# Patient Record
Sex: Female | Born: 1950 | Race: White | Hispanic: No | Marital: Single | Smoking: Never smoker
Health system: Southern US, Community
[De-identification: ages and names within clinical notes are randomized; demographics above are authoritative.]

## PROBLEM LIST (undated history)

## (undated) DIAGNOSIS — E119 Type 2 diabetes mellitus without complications: Secondary | ICD-10-CM

## (undated) DIAGNOSIS — D649 Anemia, unspecified: Secondary | ICD-10-CM

## (undated) DIAGNOSIS — M1990 Unspecified osteoarthritis, unspecified site: Secondary | ICD-10-CM

## (undated) DIAGNOSIS — I739 Peripheral vascular disease, unspecified: Secondary | ICD-10-CM

## (undated) DIAGNOSIS — I219 Acute myocardial infarction, unspecified: Secondary | ICD-10-CM

## (undated) DIAGNOSIS — G629 Polyneuropathy, unspecified: Secondary | ICD-10-CM

## (undated) DIAGNOSIS — M109 Gout, unspecified: Secondary | ICD-10-CM

## (undated) DIAGNOSIS — I10 Essential (primary) hypertension: Secondary | ICD-10-CM

## (undated) DIAGNOSIS — F419 Anxiety disorder, unspecified: Secondary | ICD-10-CM

## (undated) DIAGNOSIS — I2510 Atherosclerotic heart disease of native coronary artery without angina pectoris: Secondary | ICD-10-CM

## (undated) DIAGNOSIS — K219 Gastro-esophageal reflux disease without esophagitis: Secondary | ICD-10-CM

## (undated) DIAGNOSIS — F329 Major depressive disorder, single episode, unspecified: Secondary | ICD-10-CM

## (undated) DIAGNOSIS — G4730 Sleep apnea, unspecified: Secondary | ICD-10-CM

## (undated) DIAGNOSIS — E785 Hyperlipidemia, unspecified: Secondary | ICD-10-CM

## (undated) DIAGNOSIS — F32A Depression, unspecified: Secondary | ICD-10-CM

## (undated) DIAGNOSIS — N189 Chronic kidney disease, unspecified: Secondary | ICD-10-CM

## (undated) HISTORY — PX: CARDIAC CATHETERIZATION: SHX172

## (undated) HISTORY — PX: CORONARY ARTERY BYPASS GRAFT: SHX141

## (undated) HISTORY — PX: EYE SURGERY: SHX253

## (undated) HISTORY — PX: CORONARY ANGIOPLASTY: SHX604

## (undated) HISTORY — PX: BACK SURGERY: SHX140

## (undated) HISTORY — PX: LEG SURGERY: SHX1003

## (undated) HISTORY — PX: CHOLECYSTECTOMY: SHX55

---

## 1991-10-27 DIAGNOSIS — I219 Acute myocardial infarction, unspecified: Secondary | ICD-10-CM

## 1991-10-27 HISTORY — DX: Acute myocardial infarction, unspecified: I21.9

## 2007-10-27 DIAGNOSIS — D649 Anemia, unspecified: Secondary | ICD-10-CM

## 2007-10-27 HISTORY — DX: Anemia, unspecified: D64.9

## 2016-08-18 ENCOUNTER — Encounter: Payer: Self-pay | Admitting: Emergency Medicine

## 2016-08-18 ENCOUNTER — Emergency Department: Payer: Medicare Other

## 2016-08-18 ENCOUNTER — Inpatient Hospital Stay
Admission: EM | Admit: 2016-08-18 | Discharge: 2016-08-25 | DRG: 853 | Disposition: A | Discharge status: Skilled Nursing Facility | Payer: Medicare Other | Attending: Internal Medicine | Admitting: Internal Medicine

## 2016-08-18 DIAGNOSIS — A419 Sepsis, unspecified organism: Secondary | ICD-10-CM | POA: Diagnosis present

## 2016-08-18 DIAGNOSIS — N2581 Secondary hyperparathyroidism of renal origin: Secondary | ICD-10-CM | POA: Diagnosis present

## 2016-08-18 DIAGNOSIS — E1169 Type 2 diabetes mellitus with other specified complication: Secondary | ICD-10-CM | POA: Diagnosis present

## 2016-08-18 DIAGNOSIS — I129 Hypertensive chronic kidney disease with stage 1 through stage 4 chronic kidney disease, or unspecified chronic kidney disease: Secondary | ICD-10-CM | POA: Diagnosis present

## 2016-08-18 DIAGNOSIS — L97519 Non-pressure chronic ulcer of other part of right foot with unspecified severity: Secondary | ICD-10-CM | POA: Diagnosis present

## 2016-08-18 DIAGNOSIS — Z794 Long term (current) use of insulin: Secondary | ICD-10-CM

## 2016-08-18 DIAGNOSIS — E119 Type 2 diabetes mellitus without complications: Secondary | ICD-10-CM | POA: Diagnosis not present

## 2016-08-18 DIAGNOSIS — F329 Major depressive disorder, single episode, unspecified: Secondary | ICD-10-CM | POA: Diagnosis present

## 2016-08-18 DIAGNOSIS — E785 Hyperlipidemia, unspecified: Secondary | ICD-10-CM | POA: Diagnosis present

## 2016-08-18 DIAGNOSIS — L03115 Cellulitis of right lower limb: Secondary | ICD-10-CM | POA: Diagnosis present

## 2016-08-18 DIAGNOSIS — I70248 Atherosclerosis of native arteries of left leg with ulceration of other part of lower left leg: Secondary | ICD-10-CM | POA: Diagnosis not present

## 2016-08-18 DIAGNOSIS — Z79899 Other long term (current) drug therapy: Secondary | ICD-10-CM

## 2016-08-18 DIAGNOSIS — R000 Tachycardia, unspecified: Secondary | ICD-10-CM | POA: Diagnosis present

## 2016-08-18 DIAGNOSIS — I130 Hypertensive heart and chronic kidney disease with heart failure and stage 1 through stage 4 chronic kidney disease, or unspecified chronic kidney disease: Secondary | ICD-10-CM | POA: Diagnosis present

## 2016-08-18 DIAGNOSIS — I10 Essential (primary) hypertension: Secondary | ICD-10-CM | POA: Diagnosis present

## 2016-08-18 DIAGNOSIS — N184 Chronic kidney disease, stage 4 (severe): Secondary | ICD-10-CM | POA: Diagnosis present

## 2016-08-18 DIAGNOSIS — R0600 Dyspnea, unspecified: Secondary | ICD-10-CM

## 2016-08-18 DIAGNOSIS — E1121 Type 2 diabetes mellitus with diabetic nephropathy: Secondary | ICD-10-CM | POA: Diagnosis present

## 2016-08-18 DIAGNOSIS — M1990 Unspecified osteoarthritis, unspecified site: Secondary | ICD-10-CM | POA: Diagnosis present

## 2016-08-18 DIAGNOSIS — Z951 Presence of aortocoronary bypass graft: Secondary | ICD-10-CM

## 2016-08-18 DIAGNOSIS — F419 Anxiety disorder, unspecified: Secondary | ICD-10-CM | POA: Diagnosis present

## 2016-08-18 DIAGNOSIS — N179 Acute kidney failure, unspecified: Secondary | ICD-10-CM | POA: Diagnosis present

## 2016-08-18 DIAGNOSIS — I2510 Atherosclerotic heart disease of native coronary artery without angina pectoris: Secondary | ICD-10-CM | POA: Diagnosis present

## 2016-08-18 DIAGNOSIS — T148XXA Other injury of unspecified body region, initial encounter: Secondary | ICD-10-CM

## 2016-08-18 DIAGNOSIS — E1152 Type 2 diabetes mellitus with diabetic peripheral angiopathy with gangrene: Secondary | ICD-10-CM | POA: Diagnosis present

## 2016-08-18 DIAGNOSIS — K219 Gastro-esophageal reflux disease without esophagitis: Secondary | ICD-10-CM | POA: Diagnosis present

## 2016-08-18 DIAGNOSIS — Z6837 Body mass index (BMI) 37.0-37.9, adult: Secondary | ICD-10-CM

## 2016-08-18 DIAGNOSIS — L97509 Non-pressure chronic ulcer of other part of unspecified foot with unspecified severity: Secondary | ICD-10-CM

## 2016-08-18 DIAGNOSIS — E11621 Type 2 diabetes mellitus with foot ulcer: Secondary | ICD-10-CM | POA: Diagnosis present

## 2016-08-18 DIAGNOSIS — E669 Obesity, unspecified: Secondary | ICD-10-CM | POA: Diagnosis present

## 2016-08-18 DIAGNOSIS — D631 Anemia in chronic kidney disease: Secondary | ICD-10-CM | POA: Diagnosis present

## 2016-08-18 DIAGNOSIS — M869 Osteomyelitis, unspecified: Secondary | ICD-10-CM | POA: Diagnosis present

## 2016-08-18 DIAGNOSIS — I5023 Acute on chronic systolic (congestive) heart failure: Secondary | ICD-10-CM | POA: Diagnosis present

## 2016-08-18 DIAGNOSIS — E1142 Type 2 diabetes mellitus with diabetic polyneuropathy: Secondary | ICD-10-CM | POA: Diagnosis present

## 2016-08-18 DIAGNOSIS — E1140 Type 2 diabetes mellitus with diabetic neuropathy, unspecified: Secondary | ICD-10-CM | POA: Diagnosis present

## 2016-08-18 DIAGNOSIS — E11628 Type 2 diabetes mellitus with other skin complications: Secondary | ICD-10-CM | POA: Diagnosis present

## 2016-08-18 DIAGNOSIS — L98499 Non-pressure chronic ulcer of skin of other sites with unspecified severity: Secondary | ICD-10-CM | POA: Diagnosis present

## 2016-08-18 DIAGNOSIS — L89899 Pressure ulcer of other site, unspecified stage: Secondary | ICD-10-CM | POA: Diagnosis not present

## 2016-08-18 DIAGNOSIS — E1122 Type 2 diabetes mellitus with diabetic chronic kidney disease: Secondary | ICD-10-CM | POA: Diagnosis present

## 2016-08-18 DIAGNOSIS — M79604 Pain in right leg: Secondary | ICD-10-CM | POA: Diagnosis not present

## 2016-08-18 DIAGNOSIS — L089 Local infection of the skin and subcutaneous tissue, unspecified: Secondary | ICD-10-CM

## 2016-08-18 HISTORY — DX: Gastro-esophageal reflux disease without esophagitis: K21.9

## 2016-08-18 HISTORY — DX: Atherosclerotic heart disease of native coronary artery without angina pectoris: I25.10

## 2016-08-18 HISTORY — DX: Anxiety disorder, unspecified: F41.9

## 2016-08-18 HISTORY — DX: Hyperlipidemia, unspecified: E78.5

## 2016-08-18 HISTORY — DX: Type 2 diabetes mellitus without complications: E11.9

## 2016-08-18 HISTORY — DX: Essential (primary) hypertension: I10

## 2016-08-18 LAB — COMPREHENSIVE METABOLIC PANEL
ALBUMIN: 2.6 g/dL — AB (ref 3.5–5.0)
ALT: 11 U/L — ABNORMAL LOW (ref 14–54)
ANION GAP: 13 (ref 5–15)
AST: 21 U/L (ref 15–41)
Alkaline Phosphatase: 66 U/L (ref 38–126)
BUN: 46 mg/dL — AB (ref 6–20)
CHLORIDE: 100 mmol/L — AB (ref 101–111)
CO2: 20 mmol/L — ABNORMAL LOW (ref 22–32)
Calcium: 8.3 mg/dL — ABNORMAL LOW (ref 8.9–10.3)
Creatinine, Ser: 3.53 mg/dL — ABNORMAL HIGH (ref 0.44–1.00)
GFR calc Af Amer: 15 mL/min — ABNORMAL LOW (ref >60)
GFR, EST NON AFRICAN AMERICAN: 13 mL/min — AB (ref >60)
Glucose, Bld: 357 mg/dL — ABNORMAL HIGH (ref 65–99)
POTASSIUM: 3.8 mmol/L (ref 3.5–5.1)
Sodium: 133 mmol/L — ABNORMAL LOW (ref 135–145)
Total Bilirubin: 0.3 mg/dL (ref 0.3–1.2)
Total Protein: 7.1 g/dL (ref 6.5–8.1)

## 2016-08-18 LAB — CBC WITH DIFFERENTIAL/PLATELET
Basophils Absolute: 0 10*3/uL (ref 0–0.1)
Basophils Relative: 0 %
EOS PCT: 0 %
Eosinophils Absolute: 0 10*3/uL (ref 0–0.7)
HEMATOCRIT: 28.1 % — AB (ref 35.0–47.0)
Hemoglobin: 9.1 g/dL — ABNORMAL LOW (ref 12.0–16.0)
LYMPHS PCT: 4 %
Lymphs Abs: 0.7 10*3/uL — ABNORMAL LOW (ref 1.0–3.6)
MCH: 27.9 pg (ref 26.0–34.0)
MCHC: 32.5 g/dL (ref 32.0–36.0)
MCV: 85.8 fL (ref 80.0–100.0)
Monocytes Absolute: 1.2 10*3/uL — ABNORMAL HIGH (ref 0.2–0.9)
Monocytes Relative: 7 %
NEUTROS ABS: 15.1 10*3/uL — AB (ref 1.4–6.5)
Neutrophils Relative %: 89 %
PLATELETS: 285 10*3/uL (ref 150–440)
RBC: 3.27 MIL/uL — AB (ref 3.80–5.20)
RDW: 14.6 % — ABNORMAL HIGH (ref 11.5–14.5)
WBC: 17 10*3/uL — AB (ref 3.6–11.0)

## 2016-08-18 LAB — LACTIC ACID, PLASMA: Lactic Acid, Venous: 1.5 mmol/L (ref 0.5–1.9)

## 2016-08-18 LAB — GLUCOSE, CAPILLARY: GLUCOSE-CAPILLARY: 245 mg/dL — AB (ref 65–99)

## 2016-08-18 MED ORDER — SODIUM CHLORIDE 0.9 % IV BOLUS (SEPSIS)
1000.0000 mL | Freq: Once | INTRAVENOUS | Status: AC
  Administered 2016-08-18: 1000 mL via INTRAVENOUS

## 2016-08-18 MED ORDER — OXYCODONE HCL 5 MG PO TABS
5.0000 mg | ORAL_TABLET | ORAL | Status: DC | PRN
  Administered 2016-08-18 – 2016-08-19 (×2): 5 mg via ORAL
  Filled 2016-08-18 (×2): qty 1

## 2016-08-18 MED ORDER — SODIUM CHLORIDE 0.9 % IV SOLN
INTRAVENOUS | Status: AC
  Administered 2016-08-18: via INTRAVENOUS

## 2016-08-18 MED ORDER — ACETAMINOPHEN 500 MG PO TABS
1000.0000 mg | ORAL_TABLET | Freq: Once | ORAL | Status: AC
  Administered 2016-08-18: 1000 mg via ORAL
  Filled 2016-08-18: qty 2

## 2016-08-18 MED ORDER — ATORVASTATIN CALCIUM 10 MG PO TABS
10.0000 mg | ORAL_TABLET | Freq: Every day | ORAL | Status: DC
  Administered 2016-08-19 – 2016-08-25 (×7): 10 mg via ORAL
  Filled 2016-08-18 (×7): qty 1

## 2016-08-18 MED ORDER — FLUOXETINE HCL 20 MG PO CAPS
40.0000 mg | ORAL_CAPSULE | Freq: Every day | ORAL | Status: DC
  Administered 2016-08-19 – 2016-08-25 (×7): 40 mg via ORAL
  Filled 2016-08-18 (×7): qty 2

## 2016-08-18 MED ORDER — ONDANSETRON HCL 4 MG PO TABS: 4.0000 mg | ORAL_TABLET | Freq: Four times a day (QID) | ORAL | Status: DC | PRN

## 2016-08-18 MED ORDER — ONDANSETRON HCL 4 MG/2ML IJ SOLN: 4.0000 mg | Freq: Four times a day (QID) | INTRAMUSCULAR | Status: DC | PRN

## 2016-08-18 MED ORDER — ISOSORBIDE MONONITRATE ER 30 MG PO TB24
120.0000 mg | ORAL_TABLET | Freq: Every day | ORAL | Status: DC
Start: 2016-08-19 — End: 2016-08-25
  Administered 2016-08-19 – 2016-08-25 (×7): 120 mg via ORAL
  Filled 2016-08-18 (×7): qty 4

## 2016-08-18 MED ORDER — PANTOPRAZOLE SODIUM 40 MG PO TBEC
40.0000 mg | DELAYED_RELEASE_TABLET | Freq: Every day | ORAL | Status: DC
  Administered 2016-08-19 – 2016-08-25 (×7): 40 mg via ORAL
  Filled 2016-08-18 (×7): qty 1

## 2016-08-18 MED ORDER — VANCOMYCIN HCL 10 G IV SOLR
1250.0000 mg | INTRAVENOUS | Status: DC
  Administered 2016-08-19 – 2016-08-21 (×2): 1250 mg via INTRAVENOUS
  Filled 2016-08-18 (×3): qty 1250

## 2016-08-18 MED ORDER — SODIUM CHLORIDE 0.9 % IV BOLUS (SEPSIS)
500.0000 mL | Freq: Once | INTRAVENOUS | Status: AC
  Administered 2016-08-18: 500 mL via INTRAVENOUS

## 2016-08-18 MED ORDER — VANCOMYCIN HCL 10 G IV SOLR
1500.0000 mg | Freq: Once | INTRAVENOUS | Status: AC
  Administered 2016-08-18: 1500 mg via INTRAVENOUS
  Filled 2016-08-18: qty 1500

## 2016-08-18 MED ORDER — GABAPENTIN 400 MG PO CAPS
800.0000 mg | ORAL_CAPSULE | Freq: Two times a day (BID) | ORAL | Status: DC
  Administered 2016-08-18 – 2016-08-25 (×14): 800 mg via ORAL
  Filled 2016-08-18 (×14): qty 2

## 2016-08-18 MED ORDER — PIPERACILLIN-TAZOBACTAM 3.375 G IVPB
3.3750 g | Freq: Three times a day (TID) | INTRAVENOUS | Status: DC
  Administered 2016-08-18 – 2016-08-23 (×13): 3.375 g via INTRAVENOUS
  Filled 2016-08-18 (×13): qty 50

## 2016-08-18 MED ORDER — ACETAMINOPHEN 650 MG RE SUPP: 650.0000 mg | Freq: Four times a day (QID) | RECTAL | Status: DC | PRN

## 2016-08-18 MED ORDER — ACETAMINOPHEN 325 MG PO TABS: 650.0000 mg | ORAL_TABLET | Freq: Four times a day (QID) | ORAL | Status: DC | PRN

## 2016-08-18 MED ORDER — LORAZEPAM 1 MG PO TABS
1.0000 mg | ORAL_TABLET | Freq: Two times a day (BID) | ORAL | Status: DC
  Administered 2016-08-18 – 2016-08-25 (×14): 1 mg via ORAL
  Filled 2016-08-18 (×15): qty 1

## 2016-08-18 MED ORDER — PIPERACILLIN-TAZOBACTAM 3.375 G IVPB 30 MIN
3.3750 g | Freq: Once | INTRAVENOUS | Status: AC
  Administered 2016-08-18: 3.375 g via INTRAVENOUS
  Filled 2016-08-18: qty 50

## 2016-08-18 MED ORDER — HEPARIN SODIUM (PORCINE) 5000 UNIT/ML IJ SOLN
5000.0000 [IU] | Freq: Three times a day (TID) | INTRAMUSCULAR | Status: DC
  Administered 2016-08-18 – 2016-08-25 (×19): 5000 [IU] via SUBCUTANEOUS
  Filled 2016-08-18 (×19): qty 1

## 2016-08-18 MED ORDER — INSULIN ASPART 100 UNIT/ML ~~LOC~~ SOLN
0.0000 [IU] | Freq: Four times a day (QID) | SUBCUTANEOUS | Status: DC
  Administered 2016-08-18: 3 [IU] via SUBCUTANEOUS
  Administered 2016-08-19: 5 [IU] via SUBCUTANEOUS
  Administered 2016-08-19 – 2016-08-20 (×5): 2 [IU] via SUBCUTANEOUS
  Filled 2016-08-18 (×2): qty 2
  Filled 2016-08-18: qty 3
  Filled 2016-08-18: qty 2
  Filled 2016-08-18: qty 3
  Filled 2016-08-18: qty 5
  Filled 2016-08-18: qty 2

## 2016-08-18 NOTE — ED Notes (Signed)
Pt provided turkey sandwich tray. 

## 2016-08-18 NOTE — ED Notes (Addendum)
Pt uprite on stretcher in exam room watching TV with no c/o voiced at present; pt eating ice chips; instructed to avoid further PO intake until temperature rechecked; pt orders reviewed and verified with lab all blood samples received as ordered; pt instructed on need to obtain urine specimen and will notify nurse when ready; call bell in reach and pt voices good understanding of plan of care

## 2016-08-18 NOTE — Progress Notes (Signed)
Pt states she sleeps with CPAP and 02 @ 2 liters. Rder received

## 2016-08-18 NOTE — ED Notes (Signed)
CODE  SEPSIS  CALLED  TO  CARELINK 

## 2016-08-18 NOTE — ED Provider Notes (Signed)
[facility] Emergency Department Provider Note  ____________________________________________  Time seen: Approximately 6:45 PM  I have reviewed the triage vital signs and the nursing notes.   HISTORY  Chief Complaint Wound Infection   HPI Brooke [name] is a 65 y.o. female a history of CAD, type 2 diabetes, hypertension or presents for evaluation of ulcer on her right foot. Patient reports that she developed this ulcer 3 days ago and today noticed a black scar over it. She started to have chills and body aches today. No fever, no N/V, no cough, no shortness of breath, no chest pain, no abdominal pain, no diarrhea, no dysuria. Patient reports that she has not been checking her sugars recently so she does not know if they're been running high. She denies pain in her foot and reports that she does not have sensation on the foot.   Past Medical History:  Diagnosis Date  . Anxiety   . Coronary artery disease   . Diabetes mellitus without complication (HCC)   . GERD (gastroesophageal reflux disease)   . HLD (hyperlipidemia)   . Hypertension     Patient Active Problem List   Diagnosis Date Noted  . Sepsis (HCC) 08/18/2016  . Diabetes mellitus with foot ulcer (HCC) 08/18/2016  . HTN (hypertension) 08/18/2016  . CAD (coronary artery disease) 08/18/2016  . AKI (acute kidney injury) (HCC) 08/18/2016  . Anxiety 08/18/2016  . GERD (gastroesophageal reflux disease) 08/18/2016  . HLD (hyperlipidemia) 08/18/2016    Past Surgical History:  Procedure Laterality Date  . CHOLECYSTECTOMY    . CORONARY ARTERY BYPASS GRAFT    . LEG SURGERY Left     Prior to Admission medications   Medication Sig Start Date End Date Taking? Authorizing Provider  amLODipine (NORVASC) 5 MG tablet Take 5 mg by mouth every evening. 06/08/16  Yes Historical Provider, MD  atorvastatin (LIPITOR) 10 MG tablet Take 10 mg by mouth daily. 05/18/16  Yes Historical Provider, MD  famotidine  (PEPCID) 20 MG tablet Take 1 tablet by mouth daily. 06/24/16  Yes Historical Provider, MD  FLUoxetine (PROZAC) 40 MG capsule Take 1 capsule by mouth daily. 07/30/16  Yes Historical Provider, MD  furosemide (LASIX) 80 MG tablet Take 80 mg by mouth 2 (two) times daily.  06/08/16  Yes Historical Provider, MD  gabapentin (NEURONTIN) 800 MG tablet Take 1 tablet by mouth 2 (two) times daily. 08/03/16  Yes Historical Provider, MD  gemfibrozil (LOPID) 600 MG tablet Take 600 mg by mouth 2 (two) times daily. 06/08/16  Yes Historical Provider, MD  HUMULIN 70/30 (70-30) 100 UNIT/ML injection Inject 75-100 Units into the skin 2 (two) times daily. 100 units before breakfast and 75 units before bedtime 07/27/16  Yes Historical Provider, MD  HUMULIN R 100 UNIT/ML injection Inject 30 Units into the skin 2 (two) times daily. At lunch and supper 07/28/16  Yes Historical Provider, MD  isosorbide mononitrate (IMDUR) 120 MG 24 hr tablet Take 120 mg by mouth daily. 06/08/16  Yes Historical Provider, MD  LORazepam (ATIVAN) 1 MG tablet Take 1 tablet by mouth 2 (two) times daily. 07/28/16  Yes Historical Provider, MD  losartan (COZAAR) 100 MG tablet Take 1 tablet by mouth daily. 07/25/16  Yes Historical Provider, MD  metoprolol (LOPRESSOR) 50 MG tablet Take 2 tablets by mouth 2 (two) times daily. 07/06/16  Yes Historical Provider, MD  omega-3 acid ethyl esters (LOVAZA) 1 g capsule Take 2 capsules by mouth 2 (two) times daily. 07/28/16  Yes Historical  Provider, MD  omeprazole (PRILOSEC) 20 MG capsule Take 1 capsule by mouth daily. 06/23/16  Yes Historical Provider, MD  traMADol (ULTRAM) 50 MG tablet Take 50 mg by mouth every 6 (six) hours as needed.   Yes Historical Provider, MD  VICTOZA 18 MG/3ML SOPN Inject 1.8 mg into the skin daily. morning 08/11/16  Yes Historical Provider, MD    Allergies Review of patient's allergies indicates no known allergies.  Family History  Problem Relation Age of Onset  . Obesity Neg Hx     Social  History Social History  Substance Use Topics  . Smoking status: Never Smoker  . Smokeless tobacco: Never Used  . Alcohol use No    Review of Systems  Constitutional: + fever and body aches Eyes: Negative for visual changes. ENT: Negative for sore throat. Cardiovascular: Negative for chest pain. Respiratory: Negative for shortness of breath. Gastrointestinal: Negative for abdominal pain, vomiting or diarrhea. Genitourinary: Negative for dysuria. Musculoskeletal: Negative for back pain. Skin: Negative for rash. + R foot ulcer Neurological: Negative for headaches, weakness or numbness.  ____________________________________________   PHYSICAL EXAM:  VITAL SIGNS: ED Triage Vitals [08/18/16 1832]  Enc Vitals Group     BP (!) 157/70     Pulse Rate 88     Resp 20     Temp (!) 103.1 F (39.5 C)     Temp Source Oral     SpO2 97 %     Weight 240 lb (108.9 kg)     Height 5\' 9"  (1.753 m)     Head Circumference      Peak Flow      Pain Score      Pain Loc      Pain Edu?      Excl. in GC?     Constitutional: Alert and oriented. Well appearing and in no apparent distress. HEENT:      Head: Normocephalic and atraumatic.         Eyes: Conjunctivae are normal. Sclera is non-icteric. EOMI. PERRL      Mouth/Throat: Mucous membranes are dry.       Neck: Supple with no signs of meningismus. Cardiovascular: Regular rate and rhythm. No murmurs, gallops, or rubs. 2+ symmetrical distal pulses are present in all extremities. No JVD. Respiratory: Normal respiratory effort. Lungs are clear to auscultation bilaterally. No wheezes, crackles, or rhonchi.  Gastrointestinal: Soft, non tender, and non distended with positive bowel sounds. No rebound or guarding. Musculoskeletal: Nontender with normal range of motion in all extremities. No edema, cyanosis, or erythema of extremities. Neurologic: Normal speech and language. Face is symmetric. Moving all extremities. No gross focal neurologic  deficits are appreciated. Skin: Ulcer located on the lateral base of R 5th toe with clear discharge, foul smell, and overlying cellulitis up to the ankle. Ulcer has a central necrotic area. No crepitus.  Psychiatric: Mood and affect are normal. Speech and behavior are normal.  ____________________________________________   LABS (all labs ordered are listed, but only abnormal results are displayed)  Labs Reviewed  COMPREHENSIVE METABOLIC PANEL - Abnormal; Notable for the following:       Result Value   Sodium 133 (*)    Chloride 100 (*)    CO2 20 (*)    Glucose, Bld 357 (*)    BUN 46 (*)    Creatinine, Ser 3.53 (*)    Calcium 8.3 (*)    Albumin 2.6 (*)    ALT 11 (*)    GFR calc non  Af Amer 13 (*)    GFR calc Af Amer 15 (*)    All other components within normal limits  CBC WITH DIFFERENTIAL/PLATELET - Abnormal; Notable for the following:    WBC 17.0 (*)    RBC 3.27 (*)    Hemoglobin 9.1 (*)    HCT 28.1 (*)    RDW 14.6 (*)    Neutro Abs 15.1 (*)    Lymphs Abs 0.7 (*)    Monocytes Absolute 1.2 (*)    All other components within normal limits  CULTURE, BLOOD (ROUTINE X 2)  CULTURE, BLOOD (ROUTINE X 2)  URINE CULTURE  LACTIC ACID, PLASMA  LACTIC ACID, PLASMA  URINALYSIS COMPLETEWITH MICROSCOPIC ([facility] ONLY)  BASIC METABOLIC PANEL   ____________________________________________  EKG  NSR, rate 89, normal intervals, normal axis, no STE, T wave inversions in inferior and lateral leads. No prior for comparison ____________________________________________  RADIOLOGY  XR R foot: Soft tissue swelling with a skin ulceration gas centered about the fifth MTP joint. Small, subtle focus of loss of cortical bone in the lateral margin of the fifth metatarsal head is worrisome for osteomyelitis.  Fusion of the majority of the fourth and fifth and likely third and fourth metatarsals may be due to remote infection or congenital anomaly.  First MTP  osteoarthritis.  ____________________________________________   PROCEDURES  Procedure(s) performed: None Procedures Critical Care performed: yes  CRITICAL CARE Performed by: Nita Sickle  Total critical care time:35 minutes  Critical care time was exclusive of separately billable procedures and treating other patients.  Critical care was necessary to treat or prevent imminent or life-threatening deterioration.  Critical care was time spent personally by me on the following activities: development of treatment plan with patient and/or surrogate as well as nursing, discussions with consultants, evaluation of patient's response to treatment, examination of patient, obtaining history from patient or surrogate, ordering and performing treatments and interventions, ordering and review of laboratory studies, ordering and review of radiographic studies, pulse oximetry and re-evaluation of patient's condition.   ED Sepsis - Repeat Assessment   Performed at:   19:30  Last Vitals:    Blood pressure 136/77, pulse 88, temperature 98.3 F (36.8 C), temperature source Oral, resp. rate 19, height 5\' 9"  (1.753 m), weight 240 lb (108.9 kg), SpO2 98 %.  Heart:      RRR  Lungs:     CTAB  Capillary Refill:   brisk  Peripheral Pulse (include location): Strong radial pulses   Skin (include color):   normal  ____________________________________________   INITIAL IMPRESSION / ASSESSMENT AND PLAN / ED COURSE  65 y.o. female a history of CAD, type 2 diabetes, hypertension who presents for evaluation of infected ulcer on her right foot with foul smell, necrotic center and with overlying cellulitis. Patient with fever 103F on arrival.  Code sepsis initiated. Patient will be given IVF, IV vanc, IV zosyn. Will check for evidence of DKA. WIll get XR to evaluate for osteo. Will admit to Hospitalist.  Clinical Course    Pertinent labs & imaging results that were available during my care of the  patient were reviewed by me and considered in my medical decision making (see chart for details).    ____________________________________________   FINAL CLINICAL IMPRESSION(S) / ED DIAGNOSES  Final diagnoses:  Sepsis, due to unspecified organism (HCC)  Wound infection  Cellulitis of right lower extremity  Osteomyelitis of right foot, unspecified type (HCC)      NEW MEDICATIONS STARTED DURING THIS VISIT:  New Prescriptions   No medications on file     Note:  This document was prepared using Dragon voice recognition software and may include unintentional dictation errors.    Nita Sickle, MD 08/18/16 2238

## 2016-08-18 NOTE — H&P (Signed)
[facility] [place] [facility]   PATIENT NAME: Brooke [name]    MR#:  [id number]  DATE OF BIRTH:  1950-11-25  DATE OF ADMISSION:  08/18/2016  PRIMARY CARE PHYSICIAN: No PCP Per Patient   REQUESTING/REFERRING PHYSICIAN: Don PerkingVeronese, MD  CHIEF COMPLAINT:   Chief Complaint  Patient presents with  . Wound Infection    HISTORY OF PRESENT ILLNESS:  Brooke Maeatsy Michna  is a 65 y.o. female who presents with Diabetic foot ulcer. Patient states that for the last 4-5 days ulcers been bleeding. Today she started having some red streaking up her foot. Here she was found to meet sepsis criteria with fever and leukocytosis, and imaging called into question the possibility of osteomyelitis.  Sepsis protocol was initiated and hospitalists were called for admission.  PAST MEDICAL HISTORY:   Past Medical History:  Diagnosis Date  . Anxiety   . Coronary artery disease   . Diabetes mellitus without complication (HCC)   . GERD (gastroesophageal reflux disease)   . HLD (hyperlipidemia)   . Hypertension     PAST SURGICAL HISTORY:   Past Surgical History:  Procedure Laterality Date  . NO PAST SURGERIES      SOCIAL HISTORY:   Social History  Substance Use Topics  . Smoking status: Never Smoker  . Smokeless tobacco: Never Used  . Alcohol use No    FAMILY HISTORY:   Family History  Problem Relation Age of Onset  . Obesity Neg Hx     DRUG ALLERGIES:  No Known Allergies  MEDICATIONS AT HOME:   Prior to Admission medications   Medication Sig Start Date End Date Taking? Authorizing Provider  amLODipine (NORVASC) 5 MG tablet Take 5 mg by mouth every evening. 06/08/16  Yes Historical Provider, MD  atorvastatin (LIPITOR) 10 MG tablet Take 10 mg by mouth daily. 05/18/16  Yes Historical Provider, MD  famotidine (PEPCID) 20 MG tablet Take 1 tablet by mouth daily. 06/24/16  Yes Historical Provider, MD  FLUoxetine (PROZAC) 40 MG capsule Take 1 capsule by mouth daily.  07/30/16  Yes Historical Provider, MD  furosemide (LASIX) 80 MG tablet Take 80 mg by mouth 2 (two) times daily.  06/08/16  Yes Historical Provider, MD  gabapentin (NEURONTIN) 800 MG tablet Take 1 tablet by mouth 2 (two) times daily. 08/03/16  Yes Historical Provider, MD  gemfibrozil (LOPID) 600 MG tablet Take 600 mg by mouth 2 (two) times daily. 06/08/16  Yes Historical Provider, MD  HUMULIN 70/30 (70-30) 100 UNIT/ML injection Inject 75-100 Units into the skin 2 (two) times daily. 100 units before breakfast and 75 units before bedtime 07/27/16  Yes Historical Provider, MD  HUMULIN R 100 UNIT/ML injection Inject 30 Units into the skin 2 (two) times daily. At lunch and supper 07/28/16  Yes Historical Provider, MD  isosorbide mononitrate (IMDUR) 120 MG 24 hr tablet Take 120 mg by mouth daily. 06/08/16  Yes Historical Provider, MD  LORazepam (ATIVAN) 1 MG tablet Take 1 tablet by mouth 2 (two) times daily. 07/28/16  Yes Historical Provider, MD  losartan (COZAAR) 100 MG tablet Take 1 tablet by mouth daily. 07/25/16  Yes Historical Provider, MD  metoprolol (LOPRESSOR) 50 MG tablet Take 2 tablets by mouth 2 (two) times daily. 07/06/16  Yes Historical Provider, MD  omega-3 acid ethyl esters (LOVAZA) 1 g capsule Take 2 capsules by mouth 2 (two) times daily. 07/28/16  Yes Historical Provider, MD  omeprazole (PRILOSEC) 20 MG capsule Take 1 capsule by mouth daily. 06/23/16  Yes Historical Provider, MD  traMADol (ULTRAM) 50 MG tablet Take 50 mg by mouth every 6 (six) hours as needed.   Yes Historical Provider, MD  VICTOZA 18 MG/3ML SOPN Inject 1.8 mg into the skin daily. morning 08/11/16  Yes Historical Provider, MD    REVIEW OF SYSTEMS:  Review of Systems  Constitutional: Positive for fever. Negative for chills, malaise/fatigue and weight loss.  HENT: Negative for ear pain, hearing loss and tinnitus.   Eyes: Negative for blurred vision, double vision, pain and redness.  Respiratory: Negative for cough, hemoptysis and  shortness of breath.   Cardiovascular: Negative for chest pain, palpitations, orthopnea and leg swelling.  Gastrointestinal: Negative for abdominal pain, constipation, diarrhea, nausea and vomiting.  Genitourinary: Negative for dysuria, frequency and hematuria.  Musculoskeletal: Positive for joint pain (foot). Negative for back pain and neck pain.  Skin:       Foot ulcer  Neurological: Negative for dizziness, tremors, focal weakness and weakness.  Endo/Heme/Allergies: Negative for polydipsia. Does not bruise/bleed easily.  Psychiatric/Behavioral: Negative for depression. The patient is not nervous/anxious and does not have insomnia.      VITAL SIGNS:   Vitals:   08/18/16 1832 08/18/16 1930  BP: (!) 157/70 136/77  Pulse: 88   Resp: 20 19  Temp: (!) 103.1 F (39.5 C) 98.3 F (36.8 C)  TempSrc: Oral Oral  SpO2: 97% 98%  Weight: 108.9 kg (240 lb)   Height: 5\' 9"  (1.753 m)    Wt Readings from Last 3 Encounters:  08/18/16 108.9 kg (240 lb)    PHYSICAL EXAMINATION:  Physical Exam  Vitals reviewed. Constitutional: She is oriented to person, place, and time. She appears well-developed and well-nourished. No distress.  HENT:  Head: Normocephalic and atraumatic.  Mouth/Throat: Oropharynx is clear and moist.  Eyes: Conjunctivae and EOM are normal. Pupils are equal, round, and reactive to light. No scleral icterus.  Neck: Normal range of motion. Neck supple. No JVD present. No thyromegaly present.  Cardiovascular: Normal rate, regular rhythm and intact distal pulses.  Exam reveals no gallop and no friction rub.   No murmur heard. Respiratory: Effort normal and breath sounds normal. No respiratory distress. She has no wheezes. She has no rales.  GI: Soft. Bowel sounds are normal. She exhibits no distension. There is no tenderness.  Musculoskeletal: Normal range of motion. She exhibits no edema.  Right foot ulcer near the ball of her foot on the plantar surface with some blackened skin  in the center of the ulcer  Lymphadenopathy:    She has no cervical adenopathy.  Neurological: She is alert and oriented to person, place, and time. No cranial nerve deficit.  No dysarthria, no aphasia  Skin: Skin is warm and dry. No rash noted. There is erythema (right foot).  Psychiatric: She has a normal mood and affect. Her behavior is normal. Judgment and thought content normal.    LABORATORY PANEL:   CBC  Recent Labs Lab 08/18/16 1857  WBC 17.0*  HGB 9.1*  HCT 28.1*  PLT 285   ------------------------------------------------------------------------------------------------------------------  Chemistries   Recent Labs Lab 08/18/16 1857  NA 133*  K 3.8  CL 100*  CO2 20*  GLUCOSE 357*  BUN 46*  CREATININE 3.53*  CALCIUM 8.3*  AST 21  ALT 11*  ALKPHOS 66  BILITOT 0.3   ------------------------------------------------------------------------------------------------------------------  Cardiac Enzymes No results for input(s): TROPONINI in the last 168 hours. ------------------------------------------------------------------------------------------------------------------  RADIOLOGY:  Dg Foot Complete Right  Result Date: 08/18/2016 CLINICAL DATA:  Wound on the right foot for 2-3 days with swelling and redness. EXAM: RIGHT FOOT COMPLETE - 3+ VIEW COMPARISON:  None. FINDINGS: There is soft tissue swelling with an underlying wound and gas centered about the fifth MTP joint. Small, subtle focus of loss of cortical bone is seen in the lateral margin of the head of the fifth metatarsal. There is near complete fusion of the fourth and fifth metatarsals and likely the third and fourth metatarsals. No fracture is identified. First MTP osteoarthritis is noted. Large dorsal calcaneal spur is seen. IMPRESSION: Soft tissue swelling with a skin ulceration gas centered about the fifth MTP joint. Small, subtle focus of loss of cortical bone in the lateral margin of the fifth  metatarsal head is worrisome for osteomyelitis. Fusion of the majority of the fourth and fifth and likely third and fourth metatarsals may be due to remote infection or congenital anomaly. First MTP osteoarthritis. Electronically Signed   By: Drusilla Kanner M.D.   On: 08/18/2016 19:33    EKG:   Orders placed or performed during the hospital encounter of 08/18/16  . ED EKG 12-Lead  . ED EKG 12-Lead  . EKG 12-Lead  . EKG 12-Lead    IMPRESSION AND PLAN:  Principal Problem:   Sepsis (HCC) - IV antibiotics, cultures sent from the ED, lactic acid was within normal limits, patient is hemodynamically stable. Active Problems:   Diabetes mellitus with foot ulcer (HCC) - suggestion of osteomyelitis on imaging, podiatry consult ordered, IV antibiotics as above, sliding scale insulin with corresponding glucose checks   AKI (acute kidney injury) (HCC) - no prior baseline creatinine available to [place]. Patient reports no prior history of renal disease. Gentle IV fluids tonight, avoid nephrotoxins, monitor for improvement   HTN (hypertension) - continue home meds   CAD (coronary artery disease) - continue home meds   Anxiety - home dose anxiolytics   GERD (gastroesophageal reflux disease) - home dose PPI   HLD (hyperlipidemia) - continue home meds  All the records are reviewed and case discussed with ED provider. Management plans discussed with the patient and/or family.  DVT PROPHYLAXIS: SubQ heparin  GI PROPHYLAXIS: PPI  ADMISSION STATUS: Inpatient  CODE STATUS: Full Code Status History    This patient does not have a recorded code status. Please follow your organizational policy for patients in this situation.      TOTAL TIME TAKING CARE OF THIS PATIENT: 45 minutes.    Carmelle Bamberg FIELDING 08/18/2016, 9:12 PM  [employer] [facility]  Office  [phone]  CC: Primary care physician; No PCP Per Patient

## 2016-08-18 NOTE — Progress Notes (Signed)
Pharmacy Antibiotic Note  Brooke [name] is a 65 y.o. female admitted on 08/18/2016 with sepsis.  Pharmacy has been consulted for Zosyn and vancomycin dosing.  Plan: 1. Zosyn 3.375 gm IV Q8H EI 2. Vancomycin 1.5 gm IV x 1 in ED followed in 24 hours (stacked dosing) by vancomycin 1.25 gm IV Q36H, predicted trough 18 mcg/mL. Pharmacy will continue to follow and adjust as needed to maintain trough 15 to 20 mcg/mL.  Vd 58.1 L, Ke 0.022 hr-1, T1/2 32 hr  Height: 5\' 9"  (175.3 cm) Weight: 255 lb (115.7 kg) IBW/kg (Calculated) : 66.2  Temp (24hrs), Avg:100 F (37.8 C), Min:98.3 F (36.8 C), Max:103.1 F (39.5 C)   Recent Labs Lab 08/18/16 1857  WBC 17.0*  CREATININE 3.53*  LATICACIDVEN 1.5    Estimated Creatinine Clearance: 21.6 mL/min (by C-G formula based on SCr of 3.53 mg/dL (H)).    No Known Allergies  Thank you for allowing pharmacy to be a part of this patient's care.  Brooke [name], Pharm.D., BCPS Clinical Pharmacist  08/18/2016 11:14 PM

## 2016-08-18 NOTE — ED Triage Notes (Signed)
Pt with wound to right foot. Redness and swelling noted to right foot with odor and wound noted.

## 2016-08-19 ENCOUNTER — Inpatient Hospital Stay: Payer: Medicare Other

## 2016-08-19 LAB — SURGICAL PCR SCREEN
MRSA, PCR: NEGATIVE
STAPHYLOCOCCUS AUREUS: NEGATIVE

## 2016-08-19 LAB — CBC
HCT: 25.7 % — ABNORMAL LOW (ref 35.0–47.0)
Hemoglobin: 8.7 g/dL — ABNORMAL LOW (ref 12.0–16.0)
MCH: 28.9 pg (ref 26.0–34.0)
MCHC: 33.7 g/dL (ref 32.0–36.0)
MCV: 85.6 fL (ref 80.0–100.0)
PLATELETS: 199 10*3/uL (ref 150–440)
RBC: 3 MIL/uL — ABNORMAL LOW (ref 3.80–5.20)
RDW: 14.6 % — AB (ref 11.5–14.5)
WBC: 9.7 10*3/uL (ref 3.6–11.0)

## 2016-08-19 LAB — BASIC METABOLIC PANEL
ANION GAP: 9 (ref 5–15)
BUN: 45 mg/dL — AB (ref 6–20)
CALCIUM: 7.7 mg/dL — AB (ref 8.9–10.3)
CO2: 20 mmol/L — ABNORMAL LOW (ref 22–32)
CREATININE: 3.37 mg/dL — AB (ref 0.44–1.00)
Chloride: 108 mmol/L (ref 101–111)
GFR calc Af Amer: 15 mL/min — ABNORMAL LOW (ref >60)
GFR, EST NON AFRICAN AMERICAN: 13 mL/min — AB (ref >60)
GLUCOSE: 200 mg/dL — AB (ref 65–99)
Potassium: 3.7 mmol/L (ref 3.5–5.1)
Sodium: 137 mmol/L (ref 135–145)

## 2016-08-19 LAB — GLUCOSE, CAPILLARY
Glucose-Capillary: 206 mg/dL — ABNORMAL HIGH (ref 65–99)
Glucose-Capillary: 167 mg/dL — ABNORMAL HIGH (ref 65–99)
Glucose-Capillary: 198 mg/dL — ABNORMAL HIGH (ref 65–99)
Glucose-Capillary: 270 mg/dL — ABNORMAL HIGH (ref 65–99)

## 2016-08-19 MED ORDER — OXYCODONE HCL 5 MG PO TABS
5.0000 mg | ORAL_TABLET | ORAL | Status: DC | PRN
  Administered 2016-08-19 – 2016-08-22 (×3): 5 mg via ORAL
  Filled 2016-08-19 (×3): qty 1

## 2016-08-19 NOTE — Progress Notes (Signed)
Pharmacy Antibiotic Note  Brooke [name] is a 65 y.o. female admitted on 08/18/2016 with sepsis.  Pharmacy has been consulted for Zosyn and vancomycin dosing.  Plan: 1. Zosyn 3.375 gm IV Q8H EI 2. Vancomycin 1.5 gm IV x 1 in ED followed in 24 hours (stacked dosing) by vancomycin 1.25 gm IV Q36H, predicted trough 18 mcg/mL. Pharmacy will continue to follow and adjust as needed to maintain trough 15 to 20 mcg/mL.  Vd 58.1 L, Ke 0.022 hr-1, T1/2 32 hr  Height: 5\' 9"  (175.3 cm) Weight: 255 lb (115.7 kg) IBW/kg (Calculated) : 66.2  Temp (24hrs), Avg:99.4 F (37.4 C), Min:98.3 F (36.8 C), Max:103.1 F (39.5 C)   Recent Labs Lab 08/18/16 1857 08/19/16 0521  WBC 17.0* 9.7  CREATININE 3.53* 3.37*  LATICACIDVEN 1.5  --     Estimated Creatinine Clearance: 22.6 mL/min (by C-G formula based on SCr of 3.37 mg/dL (H)).  Will follow CrCl closely, BMET ordered for 10/26 AM labs.  No Known Allergies  Thank you for allowing pharmacy to be a part of this patient's care.  Clovia CuffLisa Julena Barbour, PharmD, BCPS 08/19/2016 9:24 AM

## 2016-08-19 NOTE — Progress Notes (Signed)
[facility] NAME: Brooke [name]    MR#:  [id number]  DATE OF BIRTH:  [birth year]  SUBJECTIVE:   Patient is here due to right diabetic foot infection. Fever resolved. Still having significant foul-smelling drainage from the right foot ulcer. Seen by podiatry and plan for incision and drainage tomorrow. MRI of the foot showing no evidence of osteomyelitis.  REVIEW OF SYSTEMS:    Review of Systems  Constitutional: Positive for chills. Negative for fever and weight loss.  HENT: Negative for congestion, nosebleeds and tinnitus.   Eyes: Negative for blurred vision, double vision and redness.  Respiratory: Negative for cough, hemoptysis and shortness of breath.   Cardiovascular: Negative for chest pain, orthopnea, leg swelling and PND.  Gastrointestinal: Negative for abdominal pain, diarrhea, melena, nausea and vomiting.  Genitourinary: Negative for dysuria, hematuria and urgency.  Musculoskeletal: Positive for joint pain (right foot pain. ). Negative for falls.  Neurological: Negative for dizziness, tingling, sensory change, focal weakness, seizures, weakness and headaches.  Endo/Heme/Allergies: Negative for polydipsia. Does not bruise/bleed easily.  Psychiatric/Behavioral: Negative for depression and memory loss. The patient is not nervous/anxious.     Nutrition: Carb control Tolerating Diet: Yes Tolerating PT: Await Eval.    DRUG ALLERGIES:  No Known Allergies  VITALS:  Blood pressure (!) 126/58, pulse 79, temperature 98.7 F (37.1 C), temperature source Oral, resp. rate 18, height 5' 9"  (1.753 m), weight 115.7 kg (255 lb), SpO2 96 %.  PHYSICAL EXAMINATION:   Physical Exam  GENERAL:  65 y.o.-year-old patient lying in the bed in no acute distress.  EYES: Pupils equal, round, reactive to light and accommodation. No scleral icterus. Extraocular muscles intact.  HEENT: Head atraumatic, normocephalic. Oropharynx and nasopharynx  clear.  NECK:  Supple, no jugular venous distention. No thyroid enlargement, no tenderness.  LUNGS: Normal breath sounds bilaterally, no wheezing, rales, rhonchi. No use of accessory muscles of respiration.  CARDIOVASCULAR: S1, S2 normal. No murmurs, rubs, or gallops.  ABDOMEN: Soft, nontender, nondistended. Bowel sounds present. No organomegaly or mass.  EXTREMITIES: No cyanosis, clubbing or edema b/l.  Right foot diabetic foot ulcer on w/ foul smelling drainage.    NEUROLOGIC: Cranial nerves II through XII are intact. No focal Motor or sensory deficits b/l.   PSYCHIATRIC: The patient is alert and oriented x 3.  Good affect.  SKIN: No obvious rash, lesion, or ulcer.    LABORATORY PANEL:   CBC  Recent Labs Lab 08/19/16 0521  WBC 9.7  HGB 8.7*  HCT 25.7*  PLT 199   ------------------------------------------------------------------------------------------------------------------  Chemistries   Recent Labs Lab 08/18/16 1857 08/19/16 0521  NA 133* 137  K 3.8 3.7  CL 100* 108  CO2 20* 20*  GLUCOSE 357* 200*  BUN 46* 45*  CREATININE 3.53* 3.37*  CALCIUM 8.3* 7.7*  AST 21  --   ALT 11*  --   ALKPHOS 66  --   BILITOT 0.3  --    ------------------------------------------------------------------------------------------------------------------  Cardiac Enzymes No results for input(s): TROPONINI in the last 168 hours. ------------------------------------------------------------------------------------------------------------------  RADIOLOGY:  Mr Foot Right Wo Contrast  Result Date: 08/19/2016 CLINICAL DATA:  Nonhealing wound at the right fifth MTP joint in a diabetic patient for over 3 months. Question osteomyelitis. EXAM: MRI OF THE RIGHT FOREFOOT WITHOUT CONTRAST TECHNIQUE: Multiplanar, multisequence MR imaging was performed. No intravenous contrast was administered. COMPARISON:  Plain films of the right foot 08/18/2016. FINDINGS: No bone marrow signal abnormality to  suggest osteomyelitis is  identified. Fusion of the fourth and fifth metatarsals is identified as seen on the prior plain films. Loss of cortical bone with a rim of T1 and T2 hypointensity is seen between proximal third and fourth metatarsals as on prior plain film and may be posttraumatic or due or infection. Joint space loss and scattered erosions are seen about the midfoot which may be due to gout and/or neuropathic change. Extensive subcutaneous edema is present about the foot. A skin wound with underlying gas at the level of the fifth MTP joint subjacent to the wound, there is appearing fluid collection which measures approximately 2.3 cm long by 1.2 cm transverse by 2.4 cm craniocaudal. Small fifth MTP joint effusion is noted. No mass is identified. IMPRESSION: Cellulitis about the foot with a focal skin wound at the level of the fifth MTP joint and underlying gas worrisome for abscess. Negative for acute osteomyelitis. Small fifth MTP joint is likely sympathetic rather than septic as there is no associated marrow signal change about fifth MTP joint. Chronic destructive change about the proximal third and fourth metatarsals could be due to remote trauma or infection. 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 Fusion of the fourth and fifth metatarsals at the level the mid and distal diaphyses. Electronically Signed   By: Inge Rise M.D.   On: 08/19/2016 10:39   Dg Foot Complete Right  Result Date: 08/18/2016 CLINICAL DATA:  Wound on the right foot for 2-3 days with swelling and redness. EXAM: RIGHT FOOT COMPLETE - 3+ VIEW COMPARISON:  None. FINDINGS: There is soft tissue swelling with an underlying wound and gas centered about the fifth MTP joint. Small, subtle focus of loss of cortical bone is seen in the lateral margin of the head of the fifth metatarsal. There is near complete fusion of the fourth and fifth metatarsals and likely the third and fourth metatarsals. No fracture is  identified. First MTP osteoarthritis is noted. Large dorsal calcaneal spur is seen. IMPRESSION: Soft tissue swelling with a skin ulceration gas centered about the fifth MTP joint. Small, subtle focus of loss of cortical bone in the lateral margin of the fifth metatarsal head is worrisome for osteomyelitis. Fusion of the majority of the fourth and fifth and likely third and fourth metatarsals may be due to remote infection or congenital anomaly. First MTP osteoarthritis. Electronically Signed   By: Inge Rise M.D.   On: 08/18/2016 19:33     ASSESSMENT AND PLAN:   64 year old female with past medical history of diabetes, diabetic neuropathy, chronic kidney disease stage III, osteoarthritis, peripheral vascular disease, coronary artery disease, anxiety who presented to the hospital due to a right foot ulcer with foul-smelling drainage.  1. Sepsis-patient met criteria given the tachycardia, leukocytosis and infected right diabetic foot ulcer. -Continue IV vancomycin, Zosyn. Follow cultures. Currently afebrile, hemodynamically stable. White cell count improved.  2. Infected right foot diabetic ulcer-cause of patient's sepsis. -Continue IV vancomycin, Zosyn. Follow cultures. -Seen by podiatry and plan for incision and drainage tomorrow. -MRI showing no evidence of osteomyelitis.  3. CKD Stage III - no baseline Cr. To compare with.   - cont to monitor.   4. Diabetes -  Cont. SSI and follow BS  5. HTN - cont. Imdur.   6. DM Neuropathy - cont. Neurontin.   7. GERD - cont. Protonix.   8. Depression - cont. Prozac.    All the records are reviewed and case discussed with Care Management/Social Worker. Management plans discussed with the  patient, family and they are in agreement.  CODE STATUS: Full code  DVT Prophylaxis: Heparin SQ  TOTAL TIME TAKING CARE OF THIS PATIENT: 30 minutes.   POSSIBLE D/C IN 1-2 DAYS, DEPENDING ON CLINICAL CONDITION.   Henreitta Leber M.D on 08/19/2016 at  2:23 PM  Between 7am to 6pm - Pager - [phone]  After 6pm go to [email or web address] - [employer] [facility]  Office  [phone]  CC: Primary care physician; No PCP Per Patient

## 2016-08-19 NOTE — Consult Note (Signed)
Patient Demographics  Brooke [name], is a 65 y.o. female   MRN: [id number]   DOB - [birth year]  Admit Date - 08/18/2016    Outpatient Primary MD for the patient is No PCP Per Patient  Consult requested in the Hospital by Houston SirenVivek J Sainani, MD, On 08/19/2016    Reason for consult diabetic foot infection right foot   With History of -  Past Medical History:  Diagnosis Date  . Anxiety   . Coronary artery disease   . Diabetes mellitus without complication (HCC)   . GERD (gastroesophageal reflux disease)   . HLD (hyperlipidemia)   . Hypertension       Past Surgical History:  Procedure Laterality Date  . CHOLECYSTECTOMY    . CORONARY ARTERY BYPASS GRAFT    . LEG SURGERY Left     in for   Chief Complaint  Patient presents with  . Wound Infection     HPI  Brooke [name]  is a 65 y.o. 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 Had surgery on the right foot 3 months ago. As of severe abscess cellulitis and infection to the fifth metatarsal head area of the right foot at this timeframe. [place] visiting [place]. [place]      Social History Social History  Substance Use Topics  . Smoking status: Never Smoker  . Smokeless tobacco: Never Used  . Alcohol use No     Family History Family History  Problem Relation Age of Onset  . Obesity Neg Hx      Prior to Admission medications   Medication Sig Start Date End Date Taking? Authorizing Provider  amLODipine (NORVASC) 5 MG tablet Take 5 mg by mouth every evening. 06/08/16  Yes Historical Provider, MD  atorvastatin (LIPITOR) 10 MG tablet Take 10 mg by mouth daily. 05/18/16  Yes Historical Provider, MD  famotidine (PEPCID) 20 MG tablet Take 1 tablet by mouth daily. 06/24/16   Yes Historical Provider, MD  FLUoxetine (PROZAC) 40 MG capsule Take 1 capsule by mouth daily. 07/30/16  Yes Historical Provider, MD  furosemide (LASIX) 80 MG tablet Take 80 mg by mouth 2 (two) times daily.  06/08/16  Yes Historical Provider, MD  gabapentin (NEURONTIN) 800 MG tablet Take 1 tablet by mouth 2 (two) times daily. 08/03/16  Yes Historical Provider, MD  gemfibrozil (LOPID) 600 MG tablet Take 600 mg by mouth 2 (two) times daily. 06/08/16  Yes Historical Provider, MD  HUMULIN 70/30 (70-30) 100 UNIT/ML injection Inject 75-100 Units into the skin 2 (two) times daily. 100 units before breakfast and 75 units before bedtime 07/27/16  Yes Historical Provider, MD  HUMULIN R 100 UNIT/ML injection Inject 30 Units into the skin 2 (two) times daily. At lunch and supper 07/28/16  Yes Historical Provider, MD  isosorbide mononitrate (IMDUR) 120 MG 24 hr tablet Take 120 mg by mouth daily. 06/08/16  Yes Historical Provider, MD  LORazepam (ATIVAN) 1 MG tablet Take 1 tablet by mouth 2 (two) times daily. 07/28/16  Yes Historical Provider, MD  losartan (COZAAR) 100 MG tablet Take 1 tablet by mouth daily.  07/25/16  Yes Historical Provider, MD  metoprolol (LOPRESSOR) 50 MG tablet Take 2 tablets by mouth 2 (two) times daily. 07/06/16  Yes Historical Provider, MD  omega-3 acid ethyl esters (LOVAZA) 1 g capsule Take 2 capsules by mouth 2 (two) times daily. 07/28/16  Yes Historical Provider, MD  omeprazole (PRILOSEC) 20 MG capsule Take 1 capsule by mouth daily. 06/23/16  Yes Historical Provider, MD  traMADol (ULTRAM) 50 MG tablet Take 50 mg by mouth every 6 (six) hours as needed.   Yes Historical Provider, MD  VICTOZA 18 MG/3ML SOPN Inject 1.8 mg into the skin daily. morning 08/11/16  Yes Historical Provider, MD    Anti-infectives    Start     Dose/Rate Route Frequency Ordered Stop   08/19/16 2000  vancomycin (VANCOCIN) 1,250 mg in sodium chloride 0.9 % 250 mL IVPB     1,250 mg 166.7 mL/hr over 90 Minutes Intravenous Every  36 hours 08/18/16 2313     08/19/16 0100  piperacillin-tazobactam (ZOSYN) IVPB 3.375 g     3.375 g 12.5 mL/hr over 240 Minutes Intravenous Every 8 hours 08/18/16 2313     08/18/16 1900  piperacillin-tazobactam (ZOSYN) IVPB 3.375 g     3.375 g 100 mL/hr over 30 Minutes Intravenous  Once 08/18/16 1845 08/18/16 1928   08/18/16 1900  vancomycin (VANCOCIN) 1,500 mg in sodium chloride 0.9 % 500 mL IVPB     1,500 mg 250 mL/hr over 120 Minutes Intravenous  Once 08/18/16 1845 08/18/16 2121      Scheduled Meds: . atorvastatin  10 mg Oral Daily  . FLUoxetine  40 mg Oral Daily  . gabapentin  800 mg Oral BID  . heparin  5,000 Units Subcutaneous Q8H  . insulin aspart  0-9 Units Subcutaneous Q6H  . isosorbide mononitrate  120 mg Oral Daily  . LORazepam  1 mg Oral BID  . pantoprazole  40 mg Oral QAC breakfast  . piperacillin-tazobactam (ZOSYN)  IV  3.375 g Intravenous Q8H  . vancomycin  1,250 mg Intravenous Q36H   Continuous Infusions:  PRN Meds:.acetaminophen **OR** acetaminophen, ondansetron **OR** ondansetron (ZOFRAN) IV, oxyCODONE  No Known Allergies  Physical Exam  Vitals  Blood pressure (!) 126/58, pulse 79, temperature 98.7 F (37.1 C), temperature source Oral, resp. rate 18, height 5\' 9"  (1.753 m), weight 115.7 kg (255 lb), SpO2 96 %.  Lower Extremity exam:  Vascular:DP pulses are difficult to palpate because there is some swelling to both feet and legs. She did have surgery on her left foot previously and healed up okay from that but uncertain as to what her vascular status is the right foot.  Dermatological: Patient has necrotic tissue on the plantar aspect of the right foot significant for necrosis ulceration there is obvious fluctuance to the tissue with superficial necrosis to the lateral and plantar aspects around the fifth metatarsal head region. Cellulitis redness progressing from that area. Cellulitis involves the foot and the ankle region. X-rays show gas in the tissues as  does MRI but no evidence of bone involvement.  Neurological: Patient is significant for fairly pronounced peripheral neuropathy.  Ortho: Patient has a conjoined fourth fifth metatarsal shaft which likely caused prominence to the fifth metatarsal head. As noted the MRI did not indicate infection in the bone as of yet but she is significant soft tissue loss I think the fifth metatarsal head prominences contributed to the development of the ulceration.  Data Review  CBC  Recent Labs Lab 08/18/16 1857 08/19/16 1610  WBC 17.0* 9.7  HGB 9.1* 8.7*  HCT 28.1* 25.7*  PLT 285 199  MCV 85.8 85.6  MCH 27.9 28.9  MCHC 32.5 33.7  RDW 14.6* 14.6*  LYMPHSABS 0.7*  --   MONOABS 1.2*  --   EOSABS 0.0  --   BASOSABS 0.0  --    ------------------------------------------------------------------------------------------------------------------  Chemistries   Recent Labs Lab 08/18/16 1857 08/19/16 0521  NA 133* 137  K 3.8 3.7  CL 100* 108  CO2 20* 20*  GLUCOSE 357* 200*  BUN 46* 45*  CREATININE 3.53* 3.37*  CALCIUM 8.3* 7.7*  AST 21  --   ALT 11*  --   ALKPHOS 66  --   BILITOT 0.3  --    -------------------------------------------------------------------------------------------------------------- Imaging results:   Mr Foot Right Wo Contrast  Result Date: 08/19/2016 CLINICAL DATA:  Nonhealing wound at the right fifth MTP joint in a diabetic patient for over 3 months. Question osteomyelitis. EXAM: MRI OF THE RIGHT FOREFOOT WITHOUT CONTRAST TECHNIQUE: Multiplanar, multisequence MR imaging was performed. No intravenous contrast was administered. COMPARISON:  Plain films of the right foot 08/18/2016. FINDINGS: No bone marrow signal abnormality to suggest osteomyelitis is identified. Fusion of the fourth and fifth metatarsals is identified as seen on the prior plain films. Loss of cortical bone with a rim of T1 and T2 hypointensity is seen between proximal third and fourth metatarsals as on  prior plain film and may be posttraumatic or due or infection. Joint space loss and scattered erosions are seen about the midfoot which may be due to gout and/or neuropathic change. Extensive subcutaneous edema is present about the foot. A skin wound with underlying gas at the level of the fifth MTP joint subjacent to the wound, there is appearing fluid collection which measures approximately 2.3 cm long by 1.2 cm transverse by 2.4 cm craniocaudal. Small fifth MTP joint effusion is noted. No mass is identified. IMPRESSION: Cellulitis about the foot with a focal skin wound at the level of the fifth MTP joint and underlying gas worrisome for abscess. Negative for acute osteomyelitis. Small fifth MTP joint is likely sympathetic rather than septic as there is no associated marrow signal change about fifth MTP joint. Chronic destructive change about the proximal third and fourth metatarsals could be due to remote trauma or infection. 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 Fusion of the fourth and fifth metatarsals at the level the mid and distal diaphyses. Electronically Signed   By: Drusilla Kanner M.D.   On: 08/19/2016 10:39   Dg Foot Complete Right  Result Date: 08/18/2016 CLINICAL DATA:  Wound on the right foot for 2-3 days with swelling and redness. EXAM: RIGHT FOOT COMPLETE - 3+ VIEW COMPARISON:  None. FINDINGS: There is soft tissue swelling with an underlying wound and gas centered about the fifth MTP joint. Small, subtle focus of loss of cortical bone is seen in the lateral margin of the head of the fifth metatarsal. There is near complete fusion of the fourth and fifth metatarsals and likely the third and fourth metatarsals. No fracture is identified. First MTP osteoarthritis is noted. Large dorsal calcaneal spur is seen. IMPRESSION: Soft tissue swelling with a skin ulceration gas centered about the fifth MTP joint. Small, subtle focus of loss of cortical bone in the lateral margin  of the fifth metatarsal head is worrisome for osteomyelitis. Fusion of the majority of the fourth and fifth and likely third and fourth metatarsals may be due to remote infection or congenital  anomaly. First MTP osteoarthritis. Electronically Signed   By: Drusilla Kanner M.D.   On: 08/18/2016 19:33    Assessment & Plan: Severe infection right foot white count is reduced from 17 down to 9 on IV antibiotics. Wound is diabetic and has severe necrosis and I anticipate patient will have significant soft tissue loss around the region. I was able to open this up at bedside at of necrotic fluctuant area and drying significant pus from the region. I packed this area open and opened up to the bottom ulceration which is also necrotic as well. She has severe neuropathy doesn't feel anything. Since she is doing better as able drain at bedside I will schedule her for tomorrow to clean this area up and incise and drain the area completely.  Principal Problem:   Sepsis (HCC) Active Problems:   Diabetes mellitus with foot ulcer (HCC)   HTN (hypertension)   CAD (coronary artery disease)   AKI (acute kidney injury) (HCC)   Anxiety   GERD (gastroesophageal reflux disease)   HLD (hyperlipidemia)     Family Communication: Plan discussed with patient .   Epimenio Sarin M.D on 08/19/2016 at 12:56 PM  Thank you for the consult, we will follow the patient with you in the Hospital.

## 2016-08-20 ENCOUNTER — Inpatient Hospital Stay: Payer: Medicare Other | Admitting: Anesthesiology

## 2016-08-20 ENCOUNTER — Encounter
Admission: EM | Disposition: A | Discharge status: Skilled Nursing Facility | Payer: Self-pay | Source: Home / Self Care | Attending: Internal Medicine

## 2016-08-20 ENCOUNTER — Encounter: Payer: Self-pay | Admitting: Anesthesiology

## 2016-08-20 DIAGNOSIS — I10 Essential (primary) hypertension: Secondary | ICD-10-CM

## 2016-08-20 DIAGNOSIS — M79604 Pain in right leg: Secondary | ICD-10-CM

## 2016-08-20 DIAGNOSIS — E119 Type 2 diabetes mellitus without complications: Secondary | ICD-10-CM

## 2016-08-20 DIAGNOSIS — L89899 Pressure ulcer of other site, unspecified stage: Secondary | ICD-10-CM

## 2016-08-20 HISTORY — PX: IRRIGATION AND DEBRIDEMENT FOOT: SHX6602

## 2016-08-20 LAB — CBC
HEMATOCRIT: 25.5 % — AB (ref 35.0–47.0)
HEMOGLOBIN: 8.6 g/dL — AB (ref 12.0–16.0)
MCH: 28.6 pg (ref 26.0–34.0)
MCHC: 33.9 g/dL (ref 32.0–36.0)
MCV: 84.4 fL (ref 80.0–100.0)
Platelets: 258 10*3/uL (ref 150–440)
RBC: 3.02 MIL/uL — ABNORMAL LOW (ref 3.80–5.20)
RDW: 14.6 % — ABNORMAL HIGH (ref 11.5–14.5)
WBC: 9.4 10*3/uL (ref 3.6–11.0)

## 2016-08-20 LAB — GLUCOSE, CAPILLARY
GLUCOSE-CAPILLARY: 197 mg/dL — AB (ref 65–99)
GLUCOSE-CAPILLARY: 250 mg/dL — AB (ref 65–99)
Glucose-Capillary: 198 mg/dL — ABNORMAL HIGH (ref 65–99)
Glucose-Capillary: 218 mg/dL — ABNORMAL HIGH (ref 65–99)
Glucose-Capillary: 183 mg/dL — ABNORMAL HIGH (ref 65–99)
Glucose-Capillary: 165 mg/dL — ABNORMAL HIGH (ref 65–99)

## 2016-08-20 LAB — BASIC METABOLIC PANEL
ANION GAP: 9 (ref 5–15)
BUN: 49 mg/dL — ABNORMAL HIGH (ref 6–20)
CHLORIDE: 111 mmol/L (ref 101–111)
CO2: 18 mmol/L — ABNORMAL LOW (ref 22–32)
Calcium: 8.1 mg/dL — ABNORMAL LOW (ref 8.9–10.3)
Creatinine, Ser: 3.46 mg/dL — ABNORMAL HIGH (ref 0.44–1.00)
GFR calc Af Amer: 15 mL/min — ABNORMAL LOW (ref >60)
GFR, EST NON AFRICAN AMERICAN: 13 mL/min — AB (ref >60)
GLUCOSE: 201 mg/dL — AB (ref 65–99)
POTASSIUM: 3.4 mmol/L — AB (ref 3.5–5.1)
Sodium: 138 mmol/L (ref 135–145)

## 2016-08-20 LAB — HEMOGLOBIN A1C
HEMOGLOBIN A1C: 6.2 % — AB (ref 4.8–5.6)
Mean Plasma Glucose: 131 mg/dL

## 2016-08-20 SURGERY — IRRIGATION AND DEBRIDEMENT FOOT
Anesthesia: General | Laterality: Right

## 2016-08-20 MED ORDER — LIDOCAINE HCL (CARDIAC) 20 MG/ML IV SOLN
INTRAVENOUS | Status: DC | PRN
  Administered 2016-08-20: 60 mg via INTRAVENOUS

## 2016-08-20 MED ORDER — SODIUM CHLORIDE 0.9 % IV SOLN
INTRAVENOUS | Status: DC | PRN
  Administered 2016-08-20: 13:00:00 via INTRAVENOUS

## 2016-08-20 MED ORDER — INSULIN ASPART PROT & ASPART (70-30 MIX) 100 UNIT/ML ~~LOC~~ SUSP: 20.0000 [IU] | Freq: Two times a day (BID) | SUBCUTANEOUS | Status: DC

## 2016-08-20 MED ORDER — INSULIN ASPART PROT & ASPART (70-30 MIX) 100 UNIT/ML ~~LOC~~ SUSP
20.0000 [IU] | Freq: Two times a day (BID) | SUBCUTANEOUS | Status: DC
  Administered 2016-08-20 – 2016-08-22 (×4): 20 [IU] via SUBCUTANEOUS
  Filled 2016-08-20 (×4): qty 20

## 2016-08-20 MED ORDER — GLYCOPYRROLATE 0.2 MG/ML IJ SOLN
INTRAMUSCULAR | Status: DC | PRN
  Administered 2016-08-20: 0.2 mg via INTRAVENOUS

## 2016-08-20 MED ORDER — FENTANYL CITRATE (PF) 100 MCG/2ML IJ SOLN: 25.0000 ug | INTRAMUSCULAR | Status: DC | PRN

## 2016-08-20 MED ORDER — FENTANYL CITRATE (PF) 100 MCG/2ML IJ SOLN
INTRAMUSCULAR | Status: DC | PRN
  Administered 2016-08-20: 25 ug via INTRAVENOUS

## 2016-08-20 MED ORDER — PROPOFOL 10 MG/ML IV BOLUS
INTRAVENOUS | Status: DC | PRN
  Administered 2016-08-20: 40 mg via INTRAVENOUS

## 2016-08-20 MED ORDER — ONDANSETRON HCL 4 MG/2ML IJ SOLN: 4.0000 mg | Freq: Once | INTRAMUSCULAR | Status: DC | PRN

## 2016-08-20 MED ORDER — SODIUM CHLORIDE 0.9 % IV SOLN
INTRAVENOUS | Status: DC
  Administered 2016-08-21: 08:00:00 via INTRAVENOUS

## 2016-08-20 MED ORDER — INSULIN ASPART 100 UNIT/ML ~~LOC~~ SOLN
0.0000 [IU] | Freq: Three times a day (TID) | SUBCUTANEOUS | Status: DC
Start: 2016-08-20 — End: 2016-08-25
  Administered 2016-08-20: 2 [IU] via SUBCUTANEOUS
  Administered 2016-08-21: 5 [IU] via SUBCUTANEOUS
  Administered 2016-08-21 – 2016-08-22 (×2): 3 [IU] via SUBCUTANEOUS
  Administered 2016-08-22 (×2): 7 [IU] via SUBCUTANEOUS
  Administered 2016-08-23: 2 [IU] via SUBCUTANEOUS
  Administered 2016-08-23: 5 [IU] via SUBCUTANEOUS
  Administered 2016-08-23: 9 [IU] via SUBCUTANEOUS
  Administered 2016-08-24: 5 [IU] via SUBCUTANEOUS
  Administered 2016-08-24 (×2): 3 [IU] via SUBCUTANEOUS
  Administered 2016-08-25: 2 [IU] via SUBCUTANEOUS
  Administered 2016-08-25: 3 [IU] via SUBCUTANEOUS
  Filled 2016-08-20: qty 3
  Filled 2016-08-20: qty 5
  Filled 2016-08-20: qty 2
  Filled 2016-08-20: qty 5
  Filled 2016-08-20: qty 9
  Filled 2016-08-20: qty 5
  Filled 2016-08-20: qty 2
  Filled 2016-08-20: qty 7
  Filled 2016-08-20: qty 3
  Filled 2016-08-20: qty 5
  Filled 2016-08-20: qty 3
  Filled 2016-08-20: qty 2
  Filled 2016-08-20: qty 3
  Filled 2016-08-20: qty 7

## 2016-08-20 MED ORDER — INSULIN ASPART 100 UNIT/ML ~~LOC~~ SOLN
0.0000 [IU] | Freq: Every day | SUBCUTANEOUS | Status: DC
Start: 2016-08-20 — End: 2016-08-25
  Administered 2016-08-20 – 2016-08-22 (×3): 3 [IU] via SUBCUTANEOUS
  Administered 2016-08-23: 2 [IU] via SUBCUTANEOUS
  Filled 2016-08-20: qty 2
  Filled 2016-08-20 (×3): qty 3

## 2016-08-20 MED ORDER — MIDAZOLAM HCL 2 MG/2ML IJ SOLN
INTRAMUSCULAR | Status: DC | PRN
  Administered 2016-08-20: 1 mg via INTRAVENOUS

## 2016-08-20 MED ORDER — CHLORHEXIDINE GLUCONATE 4 % EX LIQD: 1.0000 "application " | Freq: Once | CUTANEOUS | Status: DC

## 2016-08-20 MED ORDER — PROPOFOL 500 MG/50ML IV EMUL
INTRAVENOUS | Status: DC | PRN
  Administered 2016-08-20: 50 ug/kg/min via INTRAVENOUS

## 2016-08-20 SURGICAL SUPPLY — 40 items
BAG COUNTER SPONGE EZ (MISCELLANEOUS) IMPLANT
BANDAGE ELASTIC 3 LF NS (GAUZE/BANDAGES/DRESSINGS) ×3 IMPLANT
BANDAGE ELASTIC 4 LF NS (GAUZE/BANDAGES/DRESSINGS) ×3 IMPLANT
BANDAGE STRETCH 3X4.1 STRL (GAUZE/BANDAGES/DRESSINGS) ×3 IMPLANT
BNDG ESMARK 4X12 TAN STRL LF (GAUZE/BANDAGES/DRESSINGS) ×3 IMPLANT
BNDG GAUZE 4.5X4.1 6PLY STRL (MISCELLANEOUS) ×3 IMPLANT
CANISTER SUCT 1200ML W/VALVE (MISCELLANEOUS) ×3 IMPLANT
COUNTER SPONGE BAG EZ (MISCELLANEOUS)
CUFF TOURN 18 STER (MISCELLANEOUS) IMPLANT
CUFF TOURN DUAL PL 12 NO SLV (MISCELLANEOUS) IMPLANT
DRAPE FLUOR MINI C-ARM 54X84 (DRAPES) ×3 IMPLANT
DURAPREP 26ML APPLICATOR (WOUND CARE) ×3 IMPLANT
ELECT REM PT RETURN 9FT ADLT (ELECTROSURGICAL) ×3
ELECTRODE REM PT RTRN 9FT ADLT (ELECTROSURGICAL) ×1 IMPLANT
GAUZE PETRO XEROFOAM 1X8 (MISCELLANEOUS) ×3 IMPLANT
GAUZE SPONGE 4X4 12PLY STRL (GAUZE/BANDAGES/DRESSINGS) ×3 IMPLANT
GLOVE BIO SURGEON STRL SZ8 (GLOVE) ×3 IMPLANT
GLOVE INDICATOR 7.5 STRL GRN (GLOVE) ×3 IMPLANT
GOWN STRL REUS W/ TWL LRG LVL3 (GOWN DISPOSABLE) ×1 IMPLANT
GOWN STRL REUS W/TWL LRG LVL3 (GOWN DISPOSABLE) ×2
HANDPIECE VERSAJET DEBRIDEMENT (MISCELLANEOUS) ×3 IMPLANT
KIT RM TURNOVER STRD PROC AR (KITS) ×3 IMPLANT
LABEL OR SOLS (LABEL) ×3 IMPLANT
NEEDLE FILTER BLUNT 18X 1/2SAF (NEEDLE) ×2
NEEDLE FILTER BLUNT 18X1 1/2 (NEEDLE) ×1 IMPLANT
NEEDLE HYPO 25X1 1.5 SAFETY (NEEDLE) IMPLANT
NS IRRIG 500ML POUR BTL (IV SOLUTION) ×3 IMPLANT
PACK EXTREMITY ARMC (MISCELLANEOUS) ×3 IMPLANT
PAD ABD DERMACEA PRESS 5X9 (GAUZE/BANDAGES/DRESSINGS) ×3 IMPLANT
STOCKINETTE STRL 6IN 960660 (GAUZE/BANDAGES/DRESSINGS) ×3 IMPLANT
SUT ETHILON 3-0 FS-10 30 BLK (SUTURE) ×3
SUT ETHILON 4-0 (SUTURE)
SUT ETHILON 4-0 FS2 18XMFL BLK (SUTURE)
SUT VIC AB 3-0 SH 27 (SUTURE)
SUT VIC AB 3-0 SH 27X BRD (SUTURE) IMPLANT
SUT VIC AB 4-0 FS2 27 (SUTURE) IMPLANT
SUTURE EHLN 3-0 FS-10 30 BLK (SUTURE) ×1 IMPLANT
SUTURE ETHLN 4-0 FS2 18XMF BLK (SUTURE) IMPLANT
SYR 3ML LL SCALE MARK (SYRINGE) IMPLANT
SYRINGE 10CC LL (SYRINGE) ×3 IMPLANT

## 2016-08-20 NOTE — H&P (Signed)
H and P has been reviewed and no changes are noted.  

## 2016-08-20 NOTE — Anesthesia Procedure Notes (Signed)
Date/Time: 08/20/2016 12:38 PM Performed by: Stormy FabianURTIS, Leathia Farnell Pre-anesthesia Checklist: Patient identified, Emergency Drugs available, Suction available and Patient being monitored Patient Re-evaluated:Patient Re-evaluated prior to inductionOxygen Delivery Method: Nasal cannula Intubation Type: IV induction Dental Injury: Teeth and Oropharynx as per pre-operative assessment  Comments: Nasal cannula with etCO2 monitoring

## 2016-08-20 NOTE — Transfer of Care (Signed)
Immediate Anesthesia Transfer of Care Note  Patient: Brooke [name]  Procedure(s) Performed: Procedure(s): IRRIGATION AND DEBRIDEMENT FOOT (Right)  Patient Location: PACU  Anesthesia Type:General  Level of Consciousness: sedated  Airway & Oxygen Therapy: Patient Spontanous Breathing and Patient connected to face mask oxygen  Post-op Assessment: Report given to RN and Post -op Vital signs reviewed and stable  Post vital signs: Reviewed and stable  Last Vitals:  Vitals:   08/20/16 0716 08/20/16 1307  BP: (!) 152/67 135/64  Pulse: 86 87  Resp: 16 18  Temp: 37.3 C 36.9 C    Complications: No apparent anesthesia complications

## 2016-08-20 NOTE — Progress Notes (Signed)
Pt. Had episoce of SOB with o2 sats in 80's and heartrate in the 150's. O2 applied and pts. o2 saturation back up to 90's. VSS. No c/o pain at this time. Dr. Emmit PomfretHugelmeyer notified and ordered to watch pt. Closely.

## 2016-08-20 NOTE — Anesthesia Preprocedure Evaluation (Addendum)
Anesthesia Evaluation  Patient identified by MRN, date of birth, ID band Patient awake    Reviewed: Allergy & Precautions, NPO status , Patient's Chart, lab work & pertinent test results, reviewed documented beta blocker date and time   Airway Mallampati: III  TM Distance: >3 FB     Dental  (+) Edentulous Upper, Edentulous Lower   Pulmonary neg pulmonary ROS,    Pulmonary exam normal        Cardiovascular hypertension, Pt. on medications and Pt. on home beta blockers + CAD  Normal cardiovascular exam     Neuro/Psych Anxiety Peripheral neuropathy  Neuromuscular disease    GI/Hepatic Neg liver ROS, GERD  Medicated and Controlled,  Endo/Other  diabetes, Well Controlled, Type 2, Insulin Dependent  Renal/GU Renal InsufficiencyRenal disease  negative genitourinary   Musculoskeletal   Abdominal (+) + obese,   Peds negative pediatric ROS (+)  Hematology negative hematology ROS (+) anemia ,   Anesthesia Other Findings Past Medical History: No date: Anxiety No date: Coronary artery disease No date: Diabetes mellitus without complication (HCC) No date: GERD (gastroesophageal reflux disease) No date: HLD (hyperlipidemia) No date: Hypertension CABG  Reproductive/Obstetrics                           Anesthesia Physical Anesthesia Plan  ASA: III  Anesthesia Plan: General   Post-op Pain Management:    Induction: Intravenous  Airway Management Planned: Nasal Cannula  Additional Equipment:   Intra-op Plan:   Post-operative Plan:   Informed Consent:   Dental advisory given  Plan Discussed with: CRNA and Surgeon  Anesthesia Plan Comments:        Anesthesia Quick Evaluation

## 2016-08-20 NOTE — Progress Notes (Signed)
Inpatient Diabetes Program Recommendations  AACE/ADA: New Consensus Statement on Inpatient Glycemic Control (2015)  Target Ranges:  Prepandial:   less than 140 mg/dL      Peak postprandial:   less than 180 mg/dL (1-2 hours)      Critically ill patients:  140 - 180 mg/dL   Lab Results  Component Value Date   GLUCAP 218 (H) 08/20/2016   HGBA1C 6.2 (H) 08/19/2016    Review of Glycemic Control  Results for ELINA, STRENG (MRN [id number]) as of 08/20/2016 10:02  Ref. Range 08/19/2016 16:44 08/19/2016 22:55 08/20/2016 04:55 08/20/2016 05:18 08/20/2016 07:20  Glucose-Capillary Latest Ref Range: 65 - 99 mg/dL 198 (H) 270 (H)  198 (H) 218 (H)    Diabetes history: Type 2 Outpatient Diabetes medications: Confirmed with patient- Humulin 70/30 100units with breakfast, 75 units at bedtime, Humulin R 30 units bid lunch and supper, Victoza 1.77m/day  Current orders for Inpatient glycemic control: Novolog 0-9 units q6h  Inpatient Diabetes Program Recommendations:   Once the patient returns from surgery and is eating, consider starting Novolog 70/30 20 units bid and change Novolog correction to moderate scale tid and Novolog 0-5 units qhs.  Please order carb modified/heart healthy diet.  Met with patient at the bedside regarding doses/timing of insulin- she admits she does in fact take these doses and eats a bedtime snack each night.  She does not have hypoglycemia through the night despite the hs dose of 70/30. If her hs blood sugar is less than 1466mdl, she does not take her 70/30 insulin at bedtime.  She checks her sugar 4x/day.  She is under the care of [place] She occasionally has a low blood sugar in the early evening and treats it with glucose tablets or juice.  She does not have dentures that fit well so I have suggested she try 4 soft peppermints to treat her low blood sugar and then 1 pkg.of peanut butter crackers afterwards.  She admits to keeping peanut butter crackers  with her at all times.   JuGentry FitzRN, BA, MHA, CDE Diabetes Coordinator Inpatient Diabetes Program  [phone] Pager) [phone]10/26/2017 10:20 AM

## 2016-08-20 NOTE — Consult Note (Signed)
[facility] Vascular Consult Note  MRN : [id number]  Brooke [name] is a 65 y.o. (1950-12-26) female who presents with chief complaint of  Chief Complaint  Patient presents with  . Wound Infection  .  History of Present Illness: I am asked by Dr. Orland Jarredroxler to see the patient to assess her right leg perfusion.  The patient reports a chronic ulcer of the right foot which she has had for several weeks. She developed worsening pain, redness, and fever and was found to have a significant infection of this ulceration. She was admitted early yesterday morning and was taken to the operating room today where debridement of the infected ulcer was performed. Her wound is currently dressed. In terms of vascular history, the patient reports being told several years ago she had a blockage in her thigh artery but nothing was done as she did not have any symptoms at that time. She is not really that active so she does not describe claudication history. She denies ischemic rest pain. The ulceration has not healed despite local wound care. Discussions with Dr. Orland Jarredroxler, her bleeding at the time of surgery was fair.  Current Facility-Administered Medications  Medication Dose Route Frequency Provider Last Rate Last Dose  . [MAR Hold] acetaminophen (TYLENOL) tablet 650 mg  650 mg Oral Q6H PRN Oralia Manisavid Willis, MD       Or  . Mitzi Hansen[MAR Hold] acetaminophen (TYLENOL) suppository 650 mg  650 mg Rectal Q6H PRN Oralia Manisavid Willis, MD      . Mitzi Hansen[MAR Hold] atorvastatin (LIPITOR) tablet 10 mg  10 mg Oral Daily Oralia Manisavid Willis, MD   10 mg at 08/20/16 0756  . fentaNYL (SUBLIMAZE) injection 25 mcg  25 mcg Intravenous Q5 min PRN Yves DillPaul Carroll, MD      . Mitzi Hansen[MAR Hold] FLUoxetine (PROZAC) capsule 40 mg  40 mg Oral Daily Oralia Manisavid Willis, MD   40 mg at 08/20/16 0756  . [MAR Hold] gabapentin (NEURONTIN) capsule 800 mg  800 mg Oral BID Oralia Manisavid Willis, MD   800 mg at 08/20/16 0756  . [MAR Hold] heparin injection 5,000 Units  5,000 Units  Subcutaneous Q8H Oralia Manisavid Willis, MD   5,000 Units at 08/20/16 0522  . [MAR Hold] insulin aspart (novoLOG) injection 0-5 Units  0-5 Units Subcutaneous QHS [place] SirenVivek J Sainani, MD      . Mitzi Hansen[MAR Hold] insulin aspart (novoLOG) injection 0-9 Units  0-9 Units Subcutaneous TID WC [place] SirenVivek J Sainani, MD      . Mitzi Hansen[MAR Hold] insulin aspart protamine- aspart (NOVOLOG MIX 70/30) injection 20 Units  20 Units Subcutaneous BID WC [place] SirenVivek J Sainani, MD      . Mitzi Hansen[MAR Hold] isosorbide mononitrate (IMDUR) 24 hr tablet 120 mg  120 mg Oral Daily Oralia Manisavid Willis, MD   120 mg at 08/20/16 0756  . [MAR Hold] LORazepam (ATIVAN) tablet 1 mg  1 mg Oral BID Oralia Manisavid Willis, MD   1 mg at 08/20/16 0756  . [MAR Hold] ondansetron (ZOFRAN) tablet 4 mg  4 mg Oral Q6H PRN Oralia Manisavid Willis, MD       Or  . Mitzi Hansen[MAR Hold] ondansetron [facility](ZOFRAN) injection 4 mg  4 mg Intravenous Q6H PRN Oralia Manisavid Willis, MD      . ondansetron [facility](ZOFRAN) injection 4 mg  4 mg Intravenous Once PRN Yves DillPaul Carroll, MD      . Mitzi Hansen[MAR Hold] oxyCODONE (Oxy IR/ROXICODONE) immediate release tablet 5 mg  5 mg Oral Q4H PRN [place] SirenVivek J Sainani, MD   5 mg at 08/19/16 2122  . [  MAR Hold] pantoprazole (PROTONIX) EC tablet 40 mg  40 mg Oral QAC breakfast Oralia Manis, MD   40 mg at 08/20/16 0756  . [MAR Hold] piperacillin-tazobactam (ZOSYN) IVPB 3.375 g  3.375 g Intravenous Q8H Oralia Manis, MD   3.375 g at 08/20/16 0756  . [MAR Hold] vancomycin (VANCOCIN) 1,250 mg in sodium chloride 0.9 % 250 mL IVPB  1,250 mg Intravenous Q36H Oralia Manis, MD   1,250 mg at 08/19/16 2118    Past Medical History:  Diagnosis Date  . Anxiety   . Coronary artery disease   . Diabetes mellitus without complication (HCC)   . GERD (gastroesophageal reflux disease)   . HLD (hyperlipidemia)   . Hypertension     Past Surgical History:  Procedure Laterality Date  . CHOLECYSTECTOMY    . CORONARY ARTERY BYPASS GRAFT    . LEG SURGERY Left     Social History Social History  Substance Use Topics  . Smoking status: Never Smoker  .  Smokeless tobacco: Never Used  . Alcohol use No  No IVDU Lives in [place]  Family History Family History  Problem Relation Age of Onset  . Obesity Neg Hx   No bleeding disorders, clotting disorders, autoimmune diseases, or aneurysms  No Known Allergies   REVIEW OF SYSTEMS (Negative unless checked)  Constitutional: [] Weight loss  [x] Fever  [x] Chills Cardiac: [] Chest pain   [] Chest pressure   [] Palpitations   [] Shortness of breath when laying flat   [] Shortness of breath at rest   [] Shortness of breath with exertion. Vascular:  [] Pain in legs with walking   [] Pain in legs at rest   [] Pain in legs when laying flat   [] Claudication   [] Pain in feet when walking  [] Pain in feet at rest  [] Pain in feet when laying flat   [] History of DVT   [] Phlebitis   [] Swelling in legs   [] Varicose veins   [x] Non-healing ulcers Pulmonary:   [] Uses home oxygen   [] Productive cough   [] Hemoptysis   [] Wheeze  [] COPD   [] Asthma Neurologic:  [] Dizziness  [] Blackouts   [] Seizures   [] History of stroke   [] History of TIA  [] Aphasia   [] Temporary blindness   [] Dysphagia   [] Weakness or numbness in arms   [] Weakness or numbness in legs Musculoskeletal:  [] Arthritis   [] Joint swelling   [] Joint pain   [] Low back pain Hematologic:  [] Easy bruising  [] Easy bleeding   [] Hypercoagulable state   or without used to [x] Anemic  [] Hepatitis Gastrointestinal:  [] Blood in stool   [] Vomiting blood  [] Gastroesophageal reflux/heartburn   [] Difficulty swallowing. Genitourinary:  [x] Chronic kidney disease   [] Difficult urination  [] Frequent urination  [] Burning with urination   [] Blood in urine Skin:  [] Rashes   [x] Ulcers   [x] Wounds Psychological:  [] History of anxiety   []  History of major depression.  Physical Examination  Vitals:   08/20/16 1322 08/20/16 1328 08/20/16 1336 08/20/16 1344  BP: 131/66  (!) 149/68 (!) 141/67  Pulse: 86 87 84 81  Resp: 13 20 17 16   Temp:    98.5 F (36.9 C)  TempSrc:      SpO2: 99% 100%  98% 97%  Weight:      Height:       Body mass index is 37.66 kg/m. Gen:  WD/WN, NAD Head: [place]/AT, No temporalis wasting. Prominent temp pulse not noted. Ear/Nose/Throat: Hearing grossly intact, nares w/o erythema or drainage, oropharynx w/o Erythema/Exudate Eyes: Sclera non-icteric, conjunctiva clear Neck: Trachea midline.  No JVD.  Pulmonary:  Good air movement, respirations not labored, equal bilaterally.  Cardiac: RRR, normal S1, S2. Vascular: right foot is dressed post procedure  Vessel Right Left  Radial Palpable Palpable  Ulnar Palpable Palpable  Brachial Palpable Palpable  Carotid Palpable, without bruit Palpable, without bruit  Aorta Not palpable N/A  Femoral Palpable Palpable  Popliteal Not Palpable Palpable  PT Foot dressed Not Palpable  DP Foot dressed Palpable   Gastrointestinal: soft, non-tender/non-distended. No guarding/reflex.  Musculoskeletal: M/S 5/5 throughout.  Right foot currently dressed. Mild RLE and LLE edema. Neurologic: Sensation somewhat decreased in bilateral lower extremities.  Symmetrical.  Speech is fluent. Motor exam as listed above. Psychiatric: Judgment intact, Mood & affect appropriate for pt's clinical situation. Dermatologic: No rashes or ulcers noted.  No cellulitis or open wounds. Lymph : No Cervical, Axillary, or Inguinal lymphadenopathy.      CBC Lab Results  Component Value Date   WBC 9.4 08/20/2016   HGB 8.6 (L) 08/20/2016   HCT 25.5 (L) 08/20/2016   MCV 84.4 08/20/2016   PLT 258 08/20/2016    BMET    Component Value Date/Time   NA 138 08/20/2016 0455   K 3.4 (L) 08/20/2016 0455   CL 111 08/20/2016 0455   CO2 18 (L) 08/20/2016 0455   GLUCOSE 201 (H) 08/20/2016 0455   BUN 49 (H) 08/20/2016 0455   CREATININE 3.46 (H) 08/20/2016 0455   CALCIUM 8.1 (L) 08/20/2016 0455   GFRNONAA 13 (L) 08/20/2016 0455   GFRAA 15 (L) 08/20/2016 0455   Estimated Creatinine Clearance: 22 mL/min (by C-G formula based on SCr of 3.46 mg/dL  (H)).  COAG No results found for: INR, PROTIME  Radiology Mr Foot Right Wo Contrast  Result Date: 08/19/2016 CLINICAL DATA:  Nonhealing wound at the right fifth MTP joint in a diabetic patient for over 3 months. Question osteomyelitis. EXAM: MRI OF THE RIGHT FOREFOOT WITHOUT CONTRAST TECHNIQUE: Multiplanar, multisequence MR imaging was performed. No intravenous contrast was administered. COMPARISON:  Plain films of the right foot 08/18/2016. FINDINGS: No bone marrow signal abnormality to suggest osteomyelitis is identified. Fusion of the fourth and fifth metatarsals is identified as seen on the prior plain films. Loss of cortical bone with a rim of T1 and T2 hypointensity is seen between proximal third and fourth metatarsals as on prior plain film and may be posttraumatic or due or infection. Joint space loss and scattered erosions are seen about the midfoot which may be due to gout and/or neuropathic change. Extensive subcutaneous edema is present about the foot. A skin wound with underlying gas at the level of the fifth MTP joint subjacent to the wound, there is appearing fluid collection which measures approximately 2.3 cm long by 1.2 cm transverse by 2.4 cm craniocaudal. Small fifth MTP joint effusion is noted. No mass is identified. IMPRESSION: Cellulitis about the foot with a focal skin wound at the level of the fifth MTP joint and underlying gas worrisome for abscess. Negative for acute osteomyelitis. Small fifth MTP joint is likely sympathetic rather than septic as there is no associated marrow signal change about fifth MTP joint. Chronic destructive change about the proximal third and fourth metatarsals could be due to remote trauma or infection. 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 Fusion of the fourth and fifth metatarsals at the level the mid and distal diaphyses. Electronically Signed   By: Drusilla Kanner M.D.   On: 08/19/2016 10:39   Dg Foot Complete  Right  Result Date: 08/18/2016 CLINICAL DATA:  Wound on the right foot for 2-3 days with swelling and redness. EXAM: RIGHT FOOT COMPLETE - 3+ VIEW COMPARISON:  None. FINDINGS: There is soft tissue swelling with an underlying wound and gas centered about the fifth MTP joint. Small, subtle focus of loss of cortical bone is seen in the lateral margin of the head of the fifth metatarsal. There is near complete fusion of the fourth and fifth metatarsals and likely the third and fourth metatarsals. No fracture is identified. First MTP osteoarthritis is noted. Large dorsal calcaneal spur is seen. IMPRESSION: Soft tissue swelling with a skin ulceration gas centered about the fifth MTP joint. Small, subtle focus of loss of cortical bone in the lateral margin of the fifth metatarsal head is worrisome for osteomyelitis. Fusion of the majority of the fourth and fifth and likely third and fourth metatarsals may be due to remote infection or congenital anomaly. First MTP osteoarthritis. Electronically Signed   By: Drusilla Kanner M.D.   On: 08/18/2016 19:33      Assessment/Plan 1. PAD with ulceration right lower extremity. Given her previous report of a blockage in her right thigh artery, and the location of the ulceration in a diabetic with multiple flaps or sclerotic risk factors I would recommend going ahead and proceeding with an angiogram with possible revascularization. Given her renal issues, continued hydration and limitation of contrast will be performed. I discussed the risks and benefits of the procedure in detail the patient is agreeable to proceed. This is clearly a critical and limb threatening situation and without revascularization limb loss is likely 2. Diabetes mellitus. blood glucose control important in reducing the progression of atherosclerotic disease. Also, involved in wound healing. On appropriate medications. 3. HTN. blood pressure control important in reducing the progression of  atherosclerotic disease. On appropriate oral medications. 4. Hyperlipidemia. lipid control important in reducing the progression of atherosclerotic disease. Continue statin therapy 5. CAD. Stable.   Festus Barren, MD  08/20/2016 2:02 PM    This note was created with Dragon medical transcription system.  Any error is purely unintentional

## 2016-08-20 NOTE — Op Note (Signed)
Operative note   Surgeon: Dr. Recardo EvangelistMatthew Cato Liburd, DPM.    Assistant: None    Preop diagnosis: Necrotic ulceration right fifth metatarsal with deep infection and purulence    Postop diagnosis: Same    Procedure:   1. Debridement of necrotic infected skin and soft tissue septations tissue as well as capsular tissue and tendon.          EBL: 15 cc    Anesthesia:IV sedation    Hemostasis: None    Specimen: Infected necrotic ulcerative tissue from the plantar right foot    Complications: None    Operative indications: Patient is diabetic and has peripheral neuropathy developed an ulceration on the plantar right foot she was unaware of it and it progressed into infection cellulitis and deep tissue necrosis. She had gas in the tissues yesterday an I&D the wound at bedside to alleviate pus and more definitive debridement today. MRI and x-ray were negative for bone involvement but she has severe tissue loss along that region.    Procedure:  Patient was brought into the OR and placed on the operating table in thesupine position. After anesthesia was obtained theright lower extremity was prepped and draped in usual sterile fashion.  Operative Report: This time to his directed to the lateral plantar right foot a large necrotic ulceration approximately 2.2 cm in length and 3 cm in width was noted along the lateral and plantar aspect of the fifth metatarsal head. Cellulitis was extending from this area. The area didn't incise the yesterday by me at bedside to allow for drainage to the region. A 15 blade. At this time utilized to remove the necrotic tissue and necrotic necrotic moist eschar from the area. Once this was removed a versa jet was used to remove further deeper tissue. 2 sinus tracts were noted one going proximal medial and one going proximal were identified and probed and only progressed proximally about 1 cm the dorsal sinus tract progressed about 2 cm and a separate incision was made over  this area to allow drainage and packing. A versa jet was used to remove all infected soft tissue. It was set on 3. Once the tissues looked clean and all infected and necrotic tissue was removed from the skin site days tissue and deep tissue including the joint capsule the area was then dressed. No evidence of capsular breach was noted. A wet-to-dry saline packing was utilized and a sterile dressing was placed across the area consisting of 4 x 4's ABDs pads conformation Kerlix and an Ace wrap. Patient did bleed well during the process and seemed to have good flow to the region. She will likely need further surgery to remove the fifth metatarsal and the fifth toe in order to close this wound more appropriately and also offload the original pressure point because the ulceration to start with. Patient is to remain nonweightbearing for the next couple days. Dr. Ether GriffinsFowler will take over care tomorrow.    Patient tolerated the procedure and anesthesia well.  Was transported from the OR to the PACU with all vital signs stable and vascular status intact. To be discharged per routine protocol.  Will follow up in approximately 1 week in the outpatient clinic.

## 2016-08-20 NOTE — Consult Note (Signed)
[facility]  CONSULT NOTE    Date: 08/20/2016                  Patient Name:  Brooke [name]  MRN: [id number]  DOB: 29-Sep-1951  Age / Sex: 65 y.o., female         PCP: No PCP Per Patient                 Service Requesting Consult: Dr. Cherlynn Kaiser                 Reason for Consult: Acute renal failure versus chronic kidney disease            History of Present Illness: Brooke [name] is a 65 y.o. white female with hypertension, hyperlipidemia, diabetes mellitus type II Insulin dependent, coronary artery disease status post CABG, anxiety/depression, cholecystectomy  who was admitted to [facility] on 08/18/2016 for Wound infection [T14.8XXA, L08.9] Cellulitis of right lower extremity [L03.115] Sepsis, due to unspecified organism [facility]) [A41.9] Osteomyelitis of right foot, unspecified type [facility]) [M86.9]   Nephrology consulted for elevated creatinine. Patient states she had followed with a nephrologist in [place], [place]. She does not know her baseline creatinine.    Medications: Outpatient medications: Prescriptions Prior to Admission  Medication Sig Dispense Refill Last Dose  . amLODipine (NORVASC) 5 MG tablet Take 5 mg by mouth every evening.  3 08/18/2016 at 0930  . atorvastatin (LIPITOR) 10 MG tablet Take 10 mg by mouth daily.  3 08/17/2016 at Unknown time  . famotidine (PEPCID) 20 MG tablet Take 1 tablet by mouth daily.   08/17/2016 at Unknown time  . FLUoxetine (PROZAC) 40 MG capsule Take 1 capsule by mouth daily.   08/18/2016 at 0930  . furosemide (LASIX) 80 MG tablet Take 80 mg by mouth 2 (two) times daily.   3 08/18/2016 at 0930  . gabapentin (NEURONTIN) 800 MG tablet Take 1 tablet by mouth 2 (two) times daily.   08/18/2016 at 0930  . gemfibrozil (LOPID) 600 MG tablet Take 600 mg by mouth 2 (two) times daily.  3 08/18/2016 at 0930  . HUMULIN 70/30 (70-30) 100 UNIT/ML injection Inject 75-100 Units into the skin 2 (two) times daily. 100 units before breakfast  and 75 units before bedtime  3 08/18/2016 at 0930  . HUMULIN R 100 UNIT/ML injection Inject 30 Units into the skin 2 (two) times daily. At lunch and supper   08/17/2016 at Unknown time  . isosorbide mononitrate (IMDUR) 120 MG 24 hr tablet Take 120 mg by mouth daily.  2 08/18/2016 at 0930  . LORazepam (ATIVAN) 1 MG tablet Take 1 tablet by mouth 2 (two) times daily.   08/18/2016 at 0930  . losartan (COZAAR) 100 MG tablet Take 1 tablet by mouth daily.   08/18/2016 at 0930  . metoprolol (LOPRESSOR) 50 MG tablet Take 2 tablets by mouth 2 (two) times daily.   08/18/2016 at 0930  . omega-3 acid ethyl esters (LOVAZA) 1 g capsule Take 2 capsules by mouth 2 (two) times daily.   08/18/2016 at 0930  . omeprazole (PRILOSEC) 20 MG capsule Take 1 capsule by mouth daily.   08/18/2016 at 0930  . traMADol (ULTRAM) 50 MG tablet Take 50 mg by mouth every 6 (six) hours as needed.   08/18/2016 at 0930  . VICTOZA 18 MG/3ML SOPN Inject 1.8 mg into the skin daily. morning   08/18/2016 at 0930    Current medications: Current Facility-Administered Medications  Medication Dose Route  Frequency Provider Last Rate Last Dose  . [MAR Hold] acetaminophen (TYLENOL) tablet 650 mg  650 mg Oral Q6H PRN Oralia Manisavid Willis, MD       Or  . Mitzi Hansen[MAR Hold] acetaminophen (TYLENOL) suppository 650 mg  650 mg Rectal Q6H PRN Oralia Manisavid Willis, MD      . Mitzi Hansen[MAR Hold] atorvastatin (LIPITOR) tablet 10 mg  10 mg Oral Daily Oralia Manisavid Willis, MD   10 mg at 08/20/16 0756  . fentaNYL (SUBLIMAZE) injection 25 mcg  25 mcg Intravenous Q5 min PRN Yves DillPaul Carroll, MD      . Mitzi Hansen[MAR Hold] FLUoxetine (PROZAC) capsule 40 mg  40 mg Oral Daily Oralia Manisavid Willis, MD   40 mg at 08/20/16 0756  . [MAR Hold] gabapentin (NEURONTIN) capsule 800 mg  800 mg Oral BID Oralia Manisavid Willis, MD   800 mg at 08/20/16 0756  . [MAR Hold] heparin injection 5,000 Units  5,000 Units Subcutaneous Q8H Oralia Manisavid Willis, MD   5,000 Units at 08/20/16 0522  . [MAR Hold] insulin aspart (novoLOG) injection 0-5 Units  0-5 Units  Subcutaneous QHS Houston SirenVivek J Sainani, MD      . Mitzi Hansen[MAR Hold] insulin aspart (novoLOG) injection 0-9 Units  0-9 Units Subcutaneous TID WC Houston SirenVivek J Sainani, MD      . Mitzi Hansen[MAR Hold] insulin aspart protamine- aspart (NOVOLOG MIX 70/30) injection 20 Units  20 Units Subcutaneous BID WC Houston SirenVivek J Sainani, MD      . Mitzi Hansen[MAR Hold] isosorbide mononitrate (IMDUR) 24 hr tablet 120 mg  120 mg Oral Daily Oralia Manisavid Willis, MD   120 mg at 08/20/16 0756  . [MAR Hold] LORazepam (ATIVAN) tablet 1 mg  1 mg Oral BID Oralia Manisavid Willis, MD   1 mg at 08/20/16 0756  . [MAR Hold] ondansetron (ZOFRAN) tablet 4 mg  4 mg Oral Q6H PRN Oralia Manisavid Willis, MD       Or  . Mitzi Hansen[MAR Hold] ondansetron [facility](ZOFRAN) injection 4 mg  4 mg Intravenous Q6H PRN Oralia Manisavid Willis, MD      . ondansetron [facility](ZOFRAN) injection 4 mg  4 mg Intravenous Once PRN Yves DillPaul Carroll, MD      . Mitzi Hansen[MAR Hold] oxyCODONE (Oxy IR/ROXICODONE) immediate release tablet 5 mg  5 mg Oral Q4H PRN Houston SirenVivek J Sainani, MD   5 mg at 08/19/16 2122  . [MAR Hold] pantoprazole (PROTONIX) EC tablet 40 mg  40 mg Oral QAC breakfast Oralia Manisavid Willis, MD   40 mg at 08/20/16 0756  . [MAR Hold] piperacillin-tazobactam (ZOSYN) IVPB 3.375 g  3.375 g Intravenous Q8H Oralia Manisavid Willis, MD   3.375 g at 08/20/16 0756  . [MAR Hold] vancomycin (VANCOCIN) 1,250 mg in sodium chloride 0.9 % 250 mL IVPB  1,250 mg Intravenous Q36H Oralia Manisavid Willis, MD   1,250 mg at 08/19/16 2118      Allergies: No Known Allergies    Past Medical History: Past Medical History:  Diagnosis Date  . Anxiety   . Coronary artery disease   . Diabetes mellitus without complication (HCC)   . GERD (gastroesophageal reflux disease)   . HLD (hyperlipidemia)   . Hypertension      Past Surgical History: Past Surgical History:  Procedure Laterality Date  . CHOLECYSTECTOMY    . CORONARY ARTERY BYPASS GRAFT    . LEG SURGERY Left      Family History: Family History  Problem Relation Age of Onset  . Obesity Neg Hx      Social History: Social History   Social  History  . Marital status: Single  Spouse name: N/A  . Number of children: N/A  . Years of education: N/A   Occupational History  . Not on file.   Social History Main Topics  . Smoking status: Never Smoker  . Smokeless tobacco: Never Used  . Alcohol use No  . Drug use: No  . Sexual activity: Not on file   Other Topics Concern  . Not on file   Social History Narrative  . No narrative on file     Review of Systems: Review of Systems  Constitutional: Negative.   HENT: Negative.   Eyes: Negative.   Respiratory: Negative.   Cardiovascular: Negative.   Gastrointestinal: Negative.   Genitourinary: Negative.   Musculoskeletal: Negative.   Skin: Positive for itching and rash.  Neurological: Negative.   Endo/Heme/Allergies: Negative.   Psychiatric/Behavioral: Negative.     Vital Signs: Blood pressure 131/66, pulse 87, temperature 98.5 F (36.9 C), temperature source Temporal, resp. rate 20, height 5\' 9"  (1.753 m), weight 115.7 kg (255 lb), SpO2 100 %.  Weight trends: Filed Weights   08/18/16 1832 08/18/16 2310  Weight: 108.9 kg (240 lb) 115.7 kg (255 lb)    Physical Exam: General: NAD,   Head: Normocephalic, atraumatic. Moist oral mucosal membranes  Eyes: Anicteric, PERRL  Neck: Supple, trachea midline  Lungs:  Clear to auscultation  Heart: Regular rate and rhythm  Abdomen:  Soft, nontender,   Extremities:  1+ peripheral edema.  Neurologic: Right foot in dressing, foul odor  Skin: No lesions        Lab results: Basic Metabolic Panel:  Recent Labs Lab 08/18/16 1857 08/19/16 0521 08/20/16 0455  NA 133* 137 138  K 3.8 3.7 3.4*  CL 100* 108 111  CO2 20* 20* 18*  GLUCOSE 357* 200* 201*  BUN 46* 45* 49*  CREATININE 3.53* 3.37* 3.46*  CALCIUM 8.3* 7.7* 8.1*    Liver Function Tests:  Recent Labs Lab 08/18/16 1857  AST 21  ALT 11*  ALKPHOS 66  BILITOT 0.3  PROT 7.1  ALBUMIN 2.6*   No results for input(s): LIPASE, AMYLASE in the last 168  hours. No results for input(s): AMMONIA in the last 168 hours.  CBC:  Recent Labs Lab 08/18/16 1857 08/19/16 0521 08/20/16 0455  WBC 17.0* 9.7 9.4  NEUTROABS 15.1*  --   --   HGB 9.1* 8.7* 8.6*  HCT 28.1* 25.7* 25.5*  MCV 85.8 85.6 84.4  PLT 285 199 258    Cardiac Enzymes: No results for input(s): CKTOTAL, CKMB, CKMBINDEX, TROPONINI in the last 168 hours.  BNP: Invalid input(s): POCBNP  CBG:  Recent Labs Lab 08/19/16 2255 08/20/16 0518 08/20/16 0720 08/20/16 1107 08/20/16 1312  GLUCAP 270* 198* 218* 197* 183*    Microbiology: Results for orders placed or performed during the hospital encounter of 08/18/16  Blood Culture (routine x 2)     Status: None (Preliminary result)   Collection Time: 08/18/16  6:58 PM  Result Value Ref Range Status   Specimen Description BLOOD LEFT ARM  Final   Special Requests   Final    BOTTLES DRAWN AEROBIC AND ANAEROBIC  AER10CC ANA 10CC   Culture NO GROWTH 2 DAYS  Final   Report Status PENDING  Incomplete  Blood Culture (routine x 2)     Status: None (Preliminary result)   Collection Time: 08/18/16  6:58 PM  Result Value Ref Range Status   Specimen Description BLOOD  RIGHT ARM  Final   Special Requests   Final  BOTTLES DRAWN AEROBIC AND ANAEROBIC  AER 1CC ANA 1CC   Culture NO GROWTH 2 DAYS  Final   Report Status PENDING  Incomplete  Surgical PCR screen     Status: None   Collection Time: 08/18/16 11:15 PM  Result Value Ref Range Status   MRSA, PCR NEGATIVE NEGATIVE Final   Staphylococcus aureus NEGATIVE NEGATIVE Final    Comment:        The Xpert SA Assay (FDA approved for NASAL specimens in patients over 108 years of age), is one component of a comprehensive surveillance program.  Test performance has been validated by [facility] for patients greater than or equal to 1 year old. It is not intended to diagnose infection nor to guide or monitor treatment.   Aerobic/Anaerobic Culture (surgical/deep wound)     Status:  None (Preliminary result)   Collection Time: 08/19/16  2:09 PM  Result Value Ref Range Status   Specimen Description WOUND  Final   Special Requests Normal  Final   Gram Stain   Final    RARE WBC PRESENT, PREDOMINANTLY PMN ABUNDANT GRAM POSITIVE COCCI IN PAIRS FEW GRAM NEGATIVE RODS FEW GRAM POSITIVE COCCI IN CLUSTERS    Culture   Final    CULTURE REINCUBATED FOR BETTER GROWTH Performed at [facility]    Report Status PENDING  Incomplete    Coagulation Studies: No results for input(s): LABPROT, INR in the last 72 hours.  Urinalysis: No results for input(s): COLORURINE, LABSPEC, PHURINE, GLUCOSEU, HGBUR, BILIRUBINUR, KETONESUR, PROTEINUR, UROBILINOGEN, NITRITE, LEUKOCYTESUR in the last 72 hours.  Invalid input(s): APPERANCEUR    Imaging: Mr Foot Right Wo Contrast  Result Date: 08/19/2016 CLINICAL DATA:  Nonhealing wound at the right fifth MTP joint in a diabetic patient for over 3 months. Question osteomyelitis. EXAM: MRI OF THE RIGHT FOREFOOT WITHOUT CONTRAST TECHNIQUE: Multiplanar, multisequence MR imaging was performed. No intravenous contrast was administered. COMPARISON:  Plain films of the right foot 08/18/2016. FINDINGS: No bone marrow signal abnormality to suggest osteomyelitis is identified. Fusion of the fourth and fifth metatarsals is identified as seen on the prior plain films. Loss of cortical bone with a rim of T1 and T2 hypointensity is seen between proximal third and fourth metatarsals as on prior plain film and may be posttraumatic or due or infection. Joint space loss and scattered erosions are seen about the midfoot which may be due to gout and/or neuropathic change. Extensive subcutaneous edema is present about the foot. A skin wound with underlying gas at the level of the fifth MTP joint subjacent to the wound, there is appearing fluid collection which measures approximately 2.3 cm long by 1.2 cm transverse by 2.4 cm craniocaudal. Small fifth MTP joint  effusion is noted. No mass is identified. IMPRESSION: Cellulitis about the foot with a focal skin wound at the level of the fifth MTP joint and underlying gas worrisome for abscess. Negative for acute osteomyelitis. Small fifth MTP joint is likely sympathetic rather than septic as there is no associated marrow signal change about fifth MTP joint. Chronic destructive change about the proximal third and fourth metatarsals could be due to remote trauma or infection. 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 Fusion of the fourth and fifth metatarsals at the level the mid and distal diaphyses. Electronically Signed   By: Drusilla Kanner M.D.   On: 08/19/2016 10:39   Dg Foot Complete Right  Result Date: 08/18/2016 CLINICAL DATA:  Wound on the right foot for 2-3 days  with swelling and redness. EXAM: RIGHT FOOT COMPLETE - 3+ VIEW COMPARISON:  None. FINDINGS: There is soft tissue swelling with an underlying wound and gas centered about the fifth MTP joint. Small, subtle focus of loss of cortical bone is seen in the lateral margin of the head of the fifth metatarsal. There is near complete fusion of the fourth and fifth metatarsals and likely the third and fourth metatarsals. No fracture is identified. First MTP osteoarthritis is noted. Large dorsal calcaneal spur is seen. IMPRESSION: Soft tissue swelling with a skin ulceration gas centered about the fifth MTP joint. Small, subtle focus of loss of cortical bone in the lateral margin of the fifth metatarsal head is worrisome for osteomyelitis. Fusion of the majority of the fourth and fifth and likely third and fourth metatarsals may be due to remote infection or congenital anomaly. First MTP osteoarthritis. Electronically Signed   By: Drusilla Kanner M.D.   On: 08/18/2016 19:33      Assessment & Plan: Brooke [name] is a 65 y.o. white female with hypertension, hyperlipidemia, diabetes mellitus type II Insulin dependent, coronary artery  disease status post CABG, anxiety/depression, cholecystectomy  who was admitted to [facility] on 08/18/2016 for Wound infection [T14.8XXA, L08.9] Cellulitis of right lower extremity [L03.115] Sepsis, due to unspecified organism (HCC) [A41.9] Osteomyelitis of right foot, unspecified type (HCC) [M86.9]   1. Acute renal failure versus chronic kidney disease stage V: history suggestive that this is chronic kidney disease secondary to diabetic nephropathy and hypertension. No urine studies available.  Discussed dialysis with patient.  - Renally dose all medications - Avoid ACE-I/ARB due to advanced renal disease. On losartan at home. - No acute indication for dialysis   2. Hypertension: blood pressure at goal.  - imdur - home regimen includes furosemide, metoprolol, losartan, and amlodipine.   3. Diabetes mellitus type II: insulin dependent: hemoglobin A1c 6.2%  - low most likely due to renal failure  4. Anemia of chronic kidney disease: hemoglobin low at 8.6 - check iron studies - discussed IV iron and epo  5. Secondary Hyperparathyroidism: with hypocalcemia - Check PTH, phosphorus     LOS: 2 Jahdiel Krol 10/26/20171:29 PM

## 2016-08-20 NOTE — Progress Notes (Signed)
[place] [facility] [place] NAME: Brooke [name]    MR#:  [id number]  DATE OF BIRTH:  Jan 29, 1951  SUBJECTIVE:   Patient is here due to right diabetic foot infection. Seen by Podiatry and going for I & D today.  MRI yesterday showing no evidence of Osteo.    REVIEW OF SYSTEMS:    Review of Systems  Constitutional: Positive for chills. Negative for fever and weight loss.  HENT: Negative for congestion, nosebleeds and tinnitus.   Eyes: Negative for blurred vision, double vision and redness.  Respiratory: Negative for cough, hemoptysis and shortness of breath.   Cardiovascular: Negative for chest pain, orthopnea, leg swelling and PND.  Gastrointestinal: Negative for abdominal pain, diarrhea, melena, nausea and vomiting.  Genitourinary: Negative for dysuria, hematuria and urgency.  Musculoskeletal: Positive for joint pain (right foot pain. ). Negative for falls.  Neurological: Negative for dizziness, tingling, sensory change, focal weakness, seizures, weakness and headaches.  Endo/Heme/Allergies: Negative for polydipsia. Does not bruise/bleed easily.  Psychiatric/Behavioral: Negative for depression and memory loss. The patient is not nervous/anxious.     Nutrition: Carb control Tolerating Diet: Yes Tolerating PT: Await Eval.    DRUG ALLERGIES:  No Known Allergies  VITALS:  Blood pressure (!) 152/67, pulse 86, temperature 99.1 F (37.3 C), temperature source Oral, resp. rate 16, height 5' 9"  (1.753 m), weight 115.7 kg (255 lb), SpO2 94 %.  PHYSICAL EXAMINATION:   Physical Exam  GENERAL:  65 y.o.-year-old patient lying in the bed in no acute distress.  EYES: Pupils equal, round, reactive to light and accommodation. No scleral icterus. Extraocular muscles intact.  HEENT: Head atraumatic, normocephalic. Oropharynx and nasopharynx clear.  NECK:  Supple, no jugular venous distention. No thyroid enlargement, no tenderness.  LUNGS: Normal breath  sounds bilaterally, no wheezing, rales, rhonchi. No use of accessory muscles of respiration.  CARDIOVASCULAR: S1, S2 normal. No murmurs, rubs, or gallops.  ABDOMEN: Soft, nontender, nondistended. Bowel sounds present. No organomegaly or mass.  EXTREMITIES: No cyanosis, clubbing or edema b/l.  Right foot diabetic foot ulcer on w/ foul smelling drainage with dressing on it.  NEUROLOGIC: Cranial nerves II through XII are intact. No focal Motor or sensory deficits b/l.   PSYCHIATRIC: The patient is alert and oriented x 3.  Good affect.  SKIN: No obvious rash, lesion, or ulcer.    LABORATORY PANEL:   CBC  Recent Labs Lab 08/20/16 0455  WBC 9.4  HGB 8.6*  HCT 25.5*  PLT 258   ------------------------------------------------------------------------------------------------------------------  Chemistries   Recent Labs Lab 08/18/16 1857  08/20/16 0455  NA 133*  < > 138  K 3.8  < > 3.4*  CL 100*  < > 111  CO2 20*  < > 18*  GLUCOSE 357*  < > 201*  BUN 46*  < > 49*  CREATININE 3.53*  < > 3.46*  CALCIUM 8.3*  < > 8.1*  AST 21  --   --   ALT 11*  --   --   ALKPHOS 66  --   --   BILITOT 0.3  --   --   < > = values in this interval not displayed. ------------------------------------------------------------------------------------------------------------------  Cardiac Enzymes No results for input(s): TROPONINI in the last 168 hours. ------------------------------------------------------------------------------------------------------------------  RADIOLOGY:  Mr Foot Right Wo Contrast  Result Date: 08/19/2016 CLINICAL DATA:  Nonhealing wound at the right fifth MTP joint in a diabetic patient for over 3 months. Question osteomyelitis. EXAM: MRI OF THE  RIGHT FOREFOOT WITHOUT CONTRAST TECHNIQUE: Multiplanar, multisequence MR imaging was performed. No intravenous contrast was administered. COMPARISON:  Plain films of the right foot 08/18/2016. FINDINGS: No bone marrow signal abnormality  to suggest osteomyelitis is identified. Fusion of the fourth and fifth metatarsals is identified as seen on the prior plain films. Loss of cortical bone with a rim of T1 and T2 hypointensity is seen between proximal third and fourth metatarsals as on prior plain film and may be posttraumatic or due or infection. Joint space loss and scattered erosions are seen about the midfoot which may be due to gout and/or neuropathic change. Extensive subcutaneous edema is present about the foot. A skin wound with underlying gas at the level of the fifth MTP joint subjacent to the wound, there is appearing fluid collection which measures approximately 2.3 cm long by 1.2 cm transverse by 2.4 cm craniocaudal. Small fifth MTP joint effusion is noted. No mass is identified. IMPRESSION: Cellulitis about the foot with a focal skin wound at the level of the fifth MTP joint and underlying gas worrisome for abscess. Negative for acute osteomyelitis. Small fifth MTP joint is likely sympathetic rather than septic as there is no associated marrow signal change about fifth MTP joint. Chronic destructive change about the proximal third and fourth metatarsals could be due to remote trauma or infection. 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 Fusion of the fourth and fifth metatarsals at the level the mid and distal diaphyses. Electronically Signed   By: Inge Rise M.D.   On: 08/19/2016 10:39   Dg Foot Complete Right  Result Date: 08/18/2016 CLINICAL DATA:  Wound on the right foot for 2-3 days with swelling and redness. EXAM: RIGHT FOOT COMPLETE - 3+ VIEW COMPARISON:  None. FINDINGS: There is soft tissue swelling with an underlying wound and gas centered about the fifth MTP joint. Small, subtle focus of loss of cortical bone is seen in the lateral margin of the head of the fifth metatarsal. There is near complete fusion of the fourth and fifth metatarsals and likely the third and fourth metatarsals. No fracture  is identified. First MTP osteoarthritis is noted. Large dorsal calcaneal spur is seen. IMPRESSION: Soft tissue swelling with a skin ulceration gas centered about the fifth MTP joint. Small, subtle focus of loss of cortical bone in the lateral margin of the fifth metatarsal head is worrisome for osteomyelitis. Fusion of the majority of the fourth and fifth and likely third and fourth metatarsals may be due to remote infection or congenital anomaly. First MTP osteoarthritis. Electronically Signed   By: Inge Rise M.D.   On: 08/18/2016 19:33     ASSESSMENT AND PLAN:   65 year old female with past medical history of diabetes, diabetic neuropathy, chronic kidney disease stage III, osteoarthritis, peripheral vascular disease, coronary artery disease, anxiety who presented to the hospital due to a right foot ulcer with foul-smelling drainage.  1. Sepsis-patient met criteria on admission given the tachycardia, leukocytosis and infected right diabetic foot ulcer. -Continue IV vancomycin, Zosyn. - afebrile since admission.  Hemodynamically stable.  Pre-lim wound drainage gram stain showing gram + cocci in pairs, clusters and gram (-) rod. Await culture.   2. Infected right foot diabetic ulcer-cause of patient's sepsis. -Continue IV vancomycin, Zosyn. Follow cultures. -Seen by podiatry and had bedside I&D done but going to OR today get it cleaned out.   -MRI showing no evidence of osteomyelitis.   3. CKD Stage III - no baseline Cr. To compare with.   -  will get Nephro consult but no acute indication for HD.    4. Diabetes - appreciate Diabetes Coordinator input.  - start insulin 70/30 and add SSI coverage and monitor.   5. HTN - cont. Imdur.   6. DM Neuropathy - cont. Neurontin.   7. GERD - cont. Protonix.   8. Depression - cont. Prozac.    All the records are reviewed and case discussed with Care Management/Social Worker. Management plans discussed with the patient, family and they are in  agreement.  CODE STATUS: Full code  DVT Prophylaxis: Heparin SQ  TOTAL TIME TAKING CARE OF THIS PATIENT: 30 minutes.   POSSIBLE D/C IN 1-2 DAYS, DEPENDING ON CLINICAL CONDITION.   Henreitta Leber M.D on 08/20/2016 at 12:07 PM  Between 7am to 6pm - Pager - [phone]  After 6pm go to [email or web address] - [employer] [place] [facility]  Office  [phone]  CC: Primary care physician; No PCP Per Patient

## 2016-08-21 ENCOUNTER — Encounter
Admission: EM | Disposition: A | Discharge status: Skilled Nursing Facility | Payer: Self-pay | Source: Home / Self Care | Attending: Internal Medicine

## 2016-08-21 DIAGNOSIS — I70248 Atherosclerosis of native arteries of left leg with ulceration of other part of lower left leg: Secondary | ICD-10-CM

## 2016-08-21 HISTORY — PX: PERIPHERAL VASCULAR CATHETERIZATION: SHX172C

## 2016-08-21 LAB — GLUCOSE, CAPILLARY
GLUCOSE-CAPILLARY: 225 mg/dL — AB (ref 65–99)
Glucose-Capillary: 237 mg/dL — ABNORMAL HIGH (ref 65–99)
Glucose-Capillary: 278 mg/dL — ABNORMAL HIGH (ref 65–99)
Glucose-Capillary: 268 mg/dL — ABNORMAL HIGH (ref 65–99)

## 2016-08-21 LAB — CBC
HCT: 25.4 % — ABNORMAL LOW (ref 35.0–47.0)
Hemoglobin: 8.6 g/dL — ABNORMAL LOW (ref 12.0–16.0)
MCH: 28.6 pg (ref 26.0–34.0)
MCHC: 33.7 g/dL (ref 32.0–36.0)
MCV: 84.7 fL (ref 80.0–100.0)
PLATELETS: 260 10*3/uL (ref 150–440)
RBC: 3 MIL/uL — AB (ref 3.80–5.20)
RDW: 14.7 % — AB (ref 11.5–14.5)
WBC: 7.4 10*3/uL (ref 3.6–11.0)

## 2016-08-21 LAB — FERRITIN: Ferritin: 102 ng/mL (ref 11–307)

## 2016-08-21 LAB — SURGICAL PATHOLOGY

## 2016-08-21 LAB — IRON AND TIBC
Iron: 12 ug/dL — ABNORMAL LOW (ref 28–170)
SATURATION RATIOS: 7 % — AB (ref 10.4–31.8)
TIBC: 168 ug/dL — ABNORMAL LOW (ref 250–450)
UIBC: 156 ug/dL

## 2016-08-21 LAB — PHOSPHORUS: PHOSPHORUS: 3.9 mg/dL (ref 2.5–4.6)

## 2016-08-21 SURGERY — LOWER EXTREMITY ANGIOGRAPHY
Anesthesia: Moderate Sedation | Laterality: Right

## 2016-08-21 MED ORDER — IOPAMIDOL (ISOVUE-300) INJECTION 61%
INTRAVENOUS | Status: DC | PRN
  Administered 2016-08-21: 30 mL via INTRA_ARTERIAL

## 2016-08-21 MED ORDER — SODIUM CHLORIDE 0.9 % IV SOLN
INTRAVENOUS | Status: DC
  Administered 2016-08-21: 09:00:00 via INTRAVENOUS

## 2016-08-21 MED ORDER — FUROSEMIDE 10 MG/ML IJ SOLN
40.0000 mg | Freq: Once | INTRAMUSCULAR | Status: AC
  Administered 2016-08-21: 40 mg via INTRAVENOUS
  Filled 2016-08-21: qty 4

## 2016-08-21 MED ORDER — DEXTROSE 5 % IV SOLN
1.5000 g | Freq: Once | INTRAVENOUS | Status: AC
  Administered 2016-08-21: 1.5 g via INTRAVENOUS

## 2016-08-21 MED ORDER — FENTANYL CITRATE (PF) 100 MCG/2ML IJ SOLN
INTRAMUSCULAR | Status: AC
  Filled 2016-08-21: qty 2

## 2016-08-21 MED ORDER — LIDOCAINE-EPINEPHRINE (PF) 1 %-1:200000 IJ SOLN
INTRAMUSCULAR | Status: AC
  Filled 2016-08-21: qty 30

## 2016-08-21 MED ORDER — MIDAZOLAM HCL 2 MG/2ML IJ SOLN
INTRAMUSCULAR | Status: DC | PRN
  Administered 2016-08-21 (×2): 1 mg via INTRAVENOUS

## 2016-08-21 MED ORDER — HEPARIN SODIUM (PORCINE) 1000 UNIT/ML IJ SOLN
INTRAMUSCULAR | Status: AC
  Filled 2016-08-21: qty 1

## 2016-08-21 MED ORDER — MIDAZOLAM HCL 5 MG/5ML IJ SOLN
INTRAMUSCULAR | Status: AC
  Filled 2016-08-21: qty 5

## 2016-08-21 MED ORDER — HEPARIN (PORCINE) IN NACL 2-0.9 UNIT/ML-% IJ SOLN
INTRAMUSCULAR | Status: AC
  Filled 2016-08-21: qty 1000

## 2016-08-21 MED ORDER — FENTANYL CITRATE (PF) 100 MCG/2ML IJ SOLN
INTRAMUSCULAR | Status: DC | PRN
  Administered 2016-08-21 (×2): 25 ug via INTRAVENOUS

## 2016-08-21 SURGICAL SUPPLY — 7 items
CATH PIG 70CM (CATHETERS) ×3 IMPLANT
DEVICE STARCLOSE SE CLOSURE (Vascular Products) ×3 IMPLANT
PACK ANGIOGRAPHY (CUSTOM PROCEDURE TRAY) ×3 IMPLANT
SHEATH BRITE TIP 5FRX11 (SHEATH) ×3 IMPLANT
SYR MEDRAD MARK V 150ML (SYRINGE) ×3 IMPLANT
TUBING CONTRAST HIGH PRESS 72 (TUBING) ×3 IMPLANT
WIRE J 3MM .035X145CM (WIRE) ×3 IMPLANT

## 2016-08-21 NOTE — Progress Notes (Signed)
Patient brought back from specials post angiogram, left femoral dressing intact post op, no ss of bleeding noted. [facility]. No c/o pain noted, diet order placed. Meds administred. VS wnl. Dr Cherlynn KaiserSainani at bedside assessing pt.

## 2016-08-21 NOTE — Op Note (Signed)
[facility] Percutaneous Study/Intervention Procedural Note   Date of Surgery: 08/18/2016 - 08/21/2016  Surgeon(s):Bridgid Printz   Assistants:none  Pre-operative Diagnosis: PAD with ulceration and infection right lower extremity, severe chronic kidney disease, diabetes  Post-operative diagnosis: Same  Procedure(s) Performed: 1. Ultrasound guidance for vascular access left femoral artery 2. Catheter placement into right superficial femoral artery from left femoral approach 3. Aortogram and selective right lower extremity angiogram 4.  StarClose closure device left femoral artery  EBL: 15 cc  Contrast: 30 cc  Fluoro Time: 1.4 minutes  Moderate Conscious Sedation Time: approximately 15 minutes using 2 mg of Versed and 50 mcg of Fentanyl  Indications: Patient is a 65 y.o.female with an infected ulcer of the right foot.  She has previously been told she had a blockage in her right thigh artery previously, and a thorough evaluation and possible treatment for any reduced perfusion is important for limb salvage attempts.. The patient is brought in for angiography for further evaluation and potential treatment. Risks and benefits are discussed and informed consent is obtained  Procedure: The patient was identified and appropriate procedural time out was performed. The patient was then placed supine on the table and prepped and draped in the usual sterile fashion.Moderate conscious sedation was administered during a face to face encounter with the patient throughout the procedure with my supervision of the RN administering medicines and monitoring the patient's vital signs, pulse oximetry, telemetry and mental status throughout from the start of the procedure until the patient was taken to the recovery room. Ultrasound was used to evaluate the left common femoral artery. It was patent . A digital  ultrasound image was acquired. A Seldinger needle was used to access the left common femoral artery under direct ultrasound guidance and a permanent image was performed. A 0.035 J wire was advanced without resistance and a 5Fr sheath was placed. Pigtail catheter was placed into the aorta and an AP aortogram was performed. This demonstrated normal renal artery and normal aorta and iliac segments without significant stenosis. The right renal artery was not well opacified, but this may have been due to catheter position. I then crossed the aortic bifurcation and advanced to the right femoral head. Selective right lower extremity angiogram was then performed including advancement of the pigtail catheter into the mid superficial femoral artery after the proximal imaging to help opacify the tibial vessels with a lower amount of contrast. This demonstrated normal common femoral artery, profunda femoris artery, superficial femoral artery, and popliteal artery. Normal tibial trifurcation with two-vessel runoff distally with the peroneal artery being dominant in the posterior tibial artery being small, but both being continuous distally without focal stenosis identified. 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 Her perfusion was clearly adequate for wound healing and no intervention was required. I elected to terminate the procedure. The sheath was removed and StarClose closure device was deployed in the left femoral artery with excellent hemostatic result. The patient was taken to the recovery room in stable condition having tolerated the procedure well.  Findings:  Aortogram: Normal aorta and iliac arteries without significant stenosis. No left renal artery stenosis identified. Right renal artery not particularly well seen, but possibly due to catheter position Right Lower Extremity: Normal common femoral artery, profunda  femoris artery, superficial femoral artery, and popliteal artery. Normal tibial trifurcation with two-vessel runoff distally with the peroneal artery being dominant in the posterior tibial artery being small, but both being  continuous distally without focal stenosis identified. 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   Disposition: Patient was taken to the recovery room in stable condition having tolerated the procedure well.  Complications: None  Festus BarrenJason Anayiah Howden 08/21/2016 10:05 AM   This note was created with Dragon Medical transcription system. Any errors in dictation are purely unintentional.

## 2016-08-21 NOTE — Progress Notes (Signed)
Patient picked up by orderly and taken to specials, report given to RN in specials. VS wnl at time of transfer, pt alert and oriented x4. No distres snoted. Was able [facility] get [facility].

## 2016-08-21 NOTE — Progress Notes (Signed)
Pt placed on nasal auto CPAP at 0938 with 2lpm bled in.  Patient tolerating well saturations are 95%.  Patient's RN to call if further assistance needed.

## 2016-08-21 NOTE — Progress Notes (Signed)
Notified Dr Nemiah CommanderKalisetti pt is feeling like she is smothering. Received order for lasix 40 mg iv x 1

## 2016-08-21 NOTE — Progress Notes (Signed)
[place] at [place] NAME: Brooke [name]    MR#:  [id number]  DATE OF BIRTH:  Oct 19, 1951  SUBJECTIVE:   Patient is here due to right diabetic foot infection. Seen by podiatry and status post incision and drainage of the wound yesterday. Postop day #1. Status post angiogram today showing no evidence of peripheral artery disease. Renal function stable.  REVIEW OF SYSTEMS:    Review of Systems  Constitutional: Positive for chills. Negative for fever and weight loss.  HENT: Negative for congestion, nosebleeds and tinnitus.   Eyes: Negative for blurred vision, double vision and redness.  Respiratory: Negative for cough, hemoptysis and shortness of breath.   Cardiovascular: Negative for chest pain, orthopnea, leg swelling and PND.  Gastrointestinal: Negative for abdominal pain, diarrhea, melena, nausea and vomiting.  Genitourinary: Negative for dysuria, hematuria and urgency.  Musculoskeletal: Positive for joint pain (right foot pain. ). Negative for falls.  Neurological: Negative for dizziness, tingling, sensory change, focal weakness, seizures, weakness and headaches.  Endo/Heme/Allergies: Negative for polydipsia. Does not bruise/bleed easily.  Psychiatric/Behavioral: Negative for depression and memory loss. The patient is not nervous/anxious.     Nutrition: Carb control Tolerating Diet: Yes Tolerating PT: Await Eval.    DRUG ALLERGIES:  No Known Allergies  VITALS:  Blood pressure (!) 163/78, pulse 92, temperature 99.4 F (37.4 C), temperature source Oral, resp. rate 18, height 5' 9"  (1.753 m), weight 115.7 kg (255 lb), SpO2 98 %.  PHYSICAL EXAMINATION:   Physical Exam  GENERAL:  65 y.o.-year-old obese patient sitting up in bed in no acute distress.  EYES: Pupils equal, round, reactive to light and accommodation. No scleral icterus. Extraocular muscles intact.  HEENT: Head atraumatic, normocephalic. Oropharynx and nasopharynx clear.   NECK:  Supple, no jugular venous distention. No thyroid enlargement, no tenderness.  LUNGS: Normal breath sounds bilaterally, no wheezing, rales, rhonchi. No use of accessory muscles of respiration.  CARDIOVASCULAR: S1, S2 normal. No murmurs, rubs, or gallops.  ABDOMEN: Soft, nontender, nondistended. Bowel sounds present. No organomegaly or mass.  EXTREMITIES: No cyanosis, clubbing or edema b/l.  Right foot diabetic foot ulcer s/p I & D and dressing on it.  NEUROLOGIC: Cranial nerves II through XII are intact. No focal Motor or sensory deficits b/l.   PSYCHIATRIC: The patient is alert and oriented x 3.  Good affect.  SKIN: No obvious rash, lesion, or ulcer.    LABORATORY PANEL:   CBC  Recent Labs Lab 08/21/16 0326  WBC 7.4  HGB 8.6*  HCT 25.4*  PLT 260   ------------------------------------------------------------------------------------------------------------------  Chemistries   Recent Labs Lab 08/18/16 1857  08/20/16 0455  NA 133*  < > 138  K 3.8  < > 3.4*  CL 100*  < > 111  CO2 20*  < > 18*  GLUCOSE 357*  < > 201*  BUN 46*  < > 49*  CREATININE 3.53*  < > 3.46*  CALCIUM 8.3*  < > 8.1*  AST 21  --   --   ALT 11*  --   --   ALKPHOS 66  --   --   BILITOT 0.3  --   --   < > = values in this interval not displayed. ------------------------------------------------------------------------------------------------------------------  Cardiac Enzymes No results for input(s): TROPONINI in the last 168 hours. ------------------------------------------------------------------------------------------------------------------  RADIOLOGY:  No results found.   ASSESSMENT AND PLAN:   65 year old female with past medical history of diabetes, diabetic neuropathy, chronic kidney disease stage  III, osteoarthritis, peripheral vascular disease, coronary artery disease, anxiety who presented to the hospital due to a right foot ulcer with foul-smelling drainage.  1. Sepsis-patient  met criteria on admission given the tachycardia, leukocytosis and infected right diabetic foot ulcer. -Continue IV vancomycin, Zosyn. - afebrile since admission.  Hemodynamically stable.  Pre-lim wound drainage gram stain showing gram + cocci in pairs, clusters and gram (-) rod, but culture still pending.   2. Infected right foot diabetic ulcer-cause of patient's sepsis. -Continue IV vancomycin, Zosyn. Follow cultures. -Seen by podiatry and pt. Is s/p I & D yesterday POD # 1. Seen by Vascular and s/p angiogram today and no evidence of PAD.  Await further podiatry input regarding need for possible amputation (vs) treatment with antibiotics.  -MRI showing no evidence of osteomyelitis.   3. CKD Stage III - no baseline Cr. To compare with.  -Appreciate nephrology input. 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 No acute indication for hemodialysis presently. Follow renal function closely as patient had angiogram today.  4. Diabetes - appreciate Diabetes Coordinator input.  - start insulin 70/30 and add SSI coverage and monitor.   5. HTN - cont. Imdur.   6. DM Neuropathy - cont. Neurontin.   7. GERD - cont. Protonix.   8. Depression - cont. Prozac.    Dispo-pending further Podiatry, Renal input.   All the records are reviewed and case discussed with Care Management/Social Worker. Management plans discussed with the patient, family and they are in agreement.  CODE STATUS: Full code  DVT Prophylaxis: Heparin SQ  TOTAL TIME TAKING CARE OF THIS PATIENT: 30 minutes.   POSSIBLE D/C IN 1-2 DAYS, DEPENDING ON CLINICAL CONDITION.   Henreitta Leber M.D on 08/21/2016 at 1:38 PM  Between 7am to 6pm - Pager - [phone]  After 6pm go to [email or web address] - Proofreader  [facility]  Office  [phone]  CC: Primary care physician; Jetta Lout, MD

## 2016-08-21 NOTE — Progress Notes (Addendum)
Received pt from Lydia RN 

## 2016-08-21 NOTE — Care Management (Signed)
Met with patient at bedside. She is from [place] which is approximately 5-6  Hours from [place]. In town after traveling with a friend who was visiitng her family in [place]. Patient has no local connections. She states her PCP is Dr. Jolly Mango. It is reported that patient has been noncompliant with her diabetic regime prior to arrival. Angiogram 10/27. Possible amputation to some toes. Plan is not clear at this point. Patient would rather go home and have the amputation in [place], [place]. May need DME at discharge. No other discharge needs identified.

## 2016-08-21 NOTE — Progress Notes (Addendum)
Daily Progress Note   Subjective  - Day of Surgery  Follow-up right foot I&D and debridement.  Pt from out of town and plans to return 1 week from today.    Objective Vitals:   08/21/16 1015 08/21/16 1030 08/21/16 1045 08/21/16 1117  BP: (!) 156/69 (!) 158/72 (!) 164/68 (!) 163/78  Pulse: 94 86 90 92  Resp: 20 (!) 24 (!) 23 18  Temp:      TempSrc:      SpO2: 92% 94% 96% 98%  Weight:      Height:        Physical Exam: Wound is stable with dry fascia and capsule exposed. No purulence.  Erythema is surround tthe incision as expected.  No lymphangitis  Laboratory CBC    Component Value Date/Time   WBC 7.4 08/21/2016 0326   HGB 8.6 (L) 08/21/2016 0326   HCT 25.4 (L) 08/21/2016 0326   PLT 260 08/21/2016 0326    BMET    Component Value Date/Time   NA 138 08/20/2016 0455   K 3.4 (L) 08/20/2016 0455   CL 111 08/20/2016 0455   CO2 18 (L) 08/20/2016 0455   GLUCOSE 201 (H) 08/20/2016 0455   BUN 49 (H) 08/20/2016 0455   CREATININE 3.46 (H) 08/20/2016 0455   CALCIUM 8.1 (L) 08/20/2016 0455   GFRNONAA 13 (L) 08/20/2016 0455   GFRAA 15 (L) 08/20/2016 0455    Assessment/Planning: No osteo on MRI.  Pt with dry open wound.  No purulence.   D/W pt different options. Could consider 5th ray amputation but wound is fairly large and may not close with removal of toe and metatarsal  Can consider wound vac for now and possible grafting in the future to attempt for salvage.  Can always perform ray amputation in future.  Will start wound vac while in house and can be discharged without wound vac and can f/u in outpt clinic with myself or Dr. Orland Jarredroxler next week.  Can be d/c'd with saline wet to dry.  Can then go back to her home for further w/u and wound care.  Gave pt name of podiatrist in [place] (Dr. Christell ConstantMoore) who she is familiar with.    Gwyneth RevelsFowler, Ren Aspinall A  08/21/2016, 3:11 PM

## 2016-08-21 NOTE — Progress Notes (Signed)
[facility]  ROUNDING NOTE   Subjective:   Angiogram with adequate perfusion.   Objective:  Vital signs in last 24 hours:  Temp:  [97.7 F (36.5 C)-99.6 F (37.6 C)] 99.4 F (37.4 C) (10/27 0814) Pulse Rate:  [81-94] 92 (10/27 1117) Resp:  [13-24] 18 (10/27 1117) BP: (131-185)/(57-80) 163/78 (10/27 1117) SpO2:  [91 %-100 %] 98 % (10/27 1117) Weight:  [115.7 kg (255 lb)] 115.7 kg (255 lb) (10/27 0814)  Weight change:  Filed Weights   08/18/16 1832 08/18/16 2310 08/21/16 0814  Weight: 108.9 kg (240 lb) 115.7 kg (255 lb) 115.7 kg (255 lb)    Intake/Output: I/O last 3 completed shifts: In: 1332 [P.O.:240; I.V.:642; IV Piggyback:450] Out: 655 [Urine:653; Stool:2]   Intake/Output this shift:  No intake/output data recorded.  Physical Exam: General: NAD,   Head: Normocephalic, atraumatic. Moist oral mucosal membranes  Eyes: Anicteric, PERRL  Neck: Supple, trachea midline  Lungs:  Clear to auscultation  Heart: Regular rate and rhythm  Abdomen:  Soft, nontender,   Extremities: 1+ peripheral edema, right foot in clean dressings  Neurologic: Nonfocal, moving all four extremities  Skin: No lesions  Access: none    Basic Metabolic Panel:  Recent Labs Lab 08/18/16 1857 08/19/16 0521 08/20/16 0455 08/21/16 0326  NA 133* 137 138  --   K 3.8 3.7 3.4*  --   CL 100* 108 111  --   CO2 20* 20* 18*  --   GLUCOSE 357* 200* 201*  --   BUN 46* 45* 49*  --   CREATININE 3.53* 3.37* 3.46*  --   CALCIUM 8.3* 7.7* 8.1*  --   PHOS  --   --   --  3.9    Liver Function Tests:  Recent Labs Lab 08/18/16 1857  AST 21  ALT 11*  ALKPHOS 66  BILITOT 0.3  PROT 7.1  ALBUMIN 2.6*   No results for input(s): LIPASE, AMYLASE in the last 168 hours. No results for input(s): AMMONIA in the last 168 hours.  CBC:  Recent Labs Lab 08/18/16 1857 08/19/16 0521 08/20/16 0455 08/21/16 0326  WBC 17.0* 9.7 9.4 7.4  NEUTROABS 15.1*  --   --   --   HGB 9.1* 8.7* 8.6*  8.6*  HCT 28.1* 25.7* 25.5* 25.4*  MCV 85.8 85.6 84.4 84.7  PLT 285 199 258 260    Cardiac Enzymes: No results for input(s): CKTOTAL, CKMB, CKMBINDEX, TROPONINI in the last 168 hours.  BNP: Invalid input(s): POCBNP  CBG:  Recent Labs Lab 08/20/16 1107 08/20/16 1312 08/20/16 1559 08/20/16 2154 08/21/16 0723  GLUCAP 197* 183* 165* 250* 237*    Microbiology: Results for orders placed or performed during the hospital encounter of 08/18/16  Blood Culture (routine x 2)     Status: None (Preliminary result)   Collection Time: 08/18/16  6:58 PM  Result Value Ref Range Status   Specimen Description BLOOD LEFT ARM  Final   Special Requests   Final    BOTTLES DRAWN AEROBIC AND ANAEROBIC  AER10CC ANA 10CC   Culture NO GROWTH 3 DAYS  Final   Report Status PENDING  Incomplete  Blood Culture (routine x 2)     Status: None (Preliminary result)   Collection Time: 08/18/16  6:58 PM  Result Value Ref Range Status   Specimen Description BLOOD  RIGHT ARM  Final   Special Requests   Final    BOTTLES DRAWN AEROBIC AND ANAEROBIC  AER 1CC ANA 1CC  Culture NO GROWTH 3 DAYS  Final   Report Status PENDING  Incomplete  Surgical PCR screen     Status: None   Collection Time: 08/18/16 11:15 PM  Result Value Ref Range Status   MRSA, PCR NEGATIVE NEGATIVE Final   Staphylococcus aureus NEGATIVE NEGATIVE Final    Comment:        The Xpert SA Assay (FDA approved for NASAL specimens in patients over 19 years of age), is one component of a comprehensive surveillance program.  Test performance has been validated by [facility] for patients greater than or equal to 92 year old. It is not intended to diagnose infection nor to guide or monitor treatment.   Aerobic/Anaerobic Culture (surgical/deep wound)     Status: None (Preliminary result)   Collection Time: 08/19/16  2:09 PM  Result Value Ref Range Status   Specimen Description WOUND  Final   Special Requests Normal  Final   Gram Stain    Final    RARE WBC PRESENT, PREDOMINANTLY PMN ABUNDANT GRAM POSITIVE COCCI IN PAIRS FEW GRAM NEGATIVE RODS FEW GRAM POSITIVE COCCI IN CLUSTERS    Culture   Final    CULTURE REINCUBATED FOR BETTER GROWTH Performed at [facility]    Report Status PENDING  Incomplete    Coagulation Studies: No results for input(s): LABPROT, INR in the last 72 hours.  Urinalysis: No results for input(s): COLORURINE, LABSPEC, PHURINE, GLUCOSEU, HGBUR, BILIRUBINUR, KETONESUR, PROTEINUR, UROBILINOGEN, NITRITE, LEUKOCYTESUR in the last 72 hours.  Invalid input(s): APPERANCEUR    Imaging: No results found.   Medications:   . sodium chloride 100 mL/hr at 08/21/16 0830   . atorvastatin  10 mg Oral Daily  . FLUoxetine  40 mg Oral Daily  . gabapentin  800 mg Oral BID  . heparin  5,000 Units Subcutaneous Q8H  . insulin aspart  0-5 Units Subcutaneous QHS  . insulin aspart  0-9 Units Subcutaneous TID WC  . insulin aspart protamine- aspart  20 Units Subcutaneous BID WC  . isosorbide mononitrate  120 mg Oral Daily  . LORazepam  1 mg Oral BID  . pantoprazole  40 mg Oral QAC breakfast  . piperacillin-tazobactam (ZOSYN)  IV  3.375 g Intravenous Q8H  . vancomycin  1,250 mg Intravenous Q36H   acetaminophen **OR** acetaminophen, ondansetron **OR** ondansetron (ZOFRAN) IV, oxyCODONE  Assessment/ Plan:  [name] is a 65 y.o. white female with hypertension, hyperlipidemia, diabetes mellitus type II Insulin dependent, coronary artery disease status post CABG, anxiety/depression, cholecystectomy  who was admitted to [facility] on 08/18/2016 for Wound infection [T14.8XXA, L08.9] Cellulitis of right lower extremity [L03.115] Sepsis, due to unspecified organism (HCC) [A41.9] Osteomyelitis of right foot, unspecified type (HCC) [M86.9]   1. Acute renal failure versus chronic kidney disease stage IV: history suggestive that this is chronic kidney disease secondary to diabetic nephropathy and hypertension.  No urine studies available.  Discussed dialysis with patient.  No acute indication for dialysis. Patient wants to wait to see her primary nephrologist before starting dialysis. If stable, will allow for that. Continue to monitor kidney function.  - Renally dose all medications - Avoid ACE-I/ARB due to advanced renal disease. On losartan at home.  2. Hypertension: blood pressure elevated - imdur - home regimen includes furosemide, metoprolol, losartan, and amlodipine.   3. Diabetes mellitus type II: insulin dependent: hemoglobin A1c 6.2%  - low most likely low due to renal failure  4. Anemia of chronic kidney disease: hemoglobin low at 8.6. Iron  deficiency.  - discussed IV iron and epo. Not a candidate for IV iron with active infection  5. Secondary Hyperparathyroidism: with hypocalcemia. Phosphorus at goal.  - Pending PTH  6. Peripheral vascular disease with diabetic foot ulcer: status post angiogram Dr. Wyn Quakerew on 10/27 and debridement on 10/26 Dr. Orland Jarredroxler - IV antibiotics: zosyn and vancomycin   LOS: 3 Makila Colombe 10/27/201711:37 AM

## 2016-08-21 NOTE — Anesthesia Postprocedure Evaluation (Signed)
Anesthesia Post Note  Patient: Brooke [name]  Procedure(s) Performed: Procedure(s) (LRB): IRRIGATION AND DEBRIDEMENT FOOT (Right)  Patient location during evaluation: PACU Anesthesia Type: General Level of consciousness: awake and alert and oriented Pain management: pain level controlled Vital Signs Assessment: post-procedure vital signs reviewed and stable Respiratory status: spontaneous breathing Cardiovascular status: blood pressure returned to baseline Anesthetic complications: no    Last Vitals:  Vitals:   08/21/16 1117 08/21/16 1531  BP: (!) 163/78 123/69  Pulse: 92 88  Resp: 18 18  Temp:      Last Pain:  Vitals:   08/21/16 0814  TempSrc: Oral  PainSc:                  Sevon Rotert

## 2016-08-22 LAB — URINALYSIS COMPLETE WITH MICROSCOPIC (ARMC ONLY)
BACTERIA UA: NONE SEEN
Bilirubin Urine: NEGATIVE
Glucose, UA: >500 mg/dL — AB
KETONES UR: NEGATIVE mg/dL
Leukocytes, UA: NEGATIVE
Nitrite: NEGATIVE
Specific Gravity, Urine: 1.009 (ref 1.005–1.030)
pH: 5 (ref 5.0–8.0)

## 2016-08-22 LAB — RENAL FUNCTION PANEL
ALBUMIN: 2.2 g/dL — AB (ref 3.5–5.0)
ANION GAP: 10 (ref 5–15)
BUN: 42 mg/dL — AB (ref 6–20)
CHLORIDE: 112 mmol/L — AB (ref 101–111)
CO2: 18 mmol/L — ABNORMAL LOW (ref 22–32)
Calcium: 8.6 mg/dL — ABNORMAL LOW (ref 8.9–10.3)
Creatinine, Ser: 2.87 mg/dL — ABNORMAL HIGH (ref 0.44–1.00)
GFR calc Af Amer: 19 mL/min — ABNORMAL LOW (ref >60)
GFR calc non Af Amer: 16 mL/min — ABNORMAL LOW (ref >60)
GLUCOSE: 240 mg/dL — AB (ref 65–99)
PHOSPHORUS: 3.9 mg/dL (ref 2.5–4.6)
POTASSIUM: 3.2 mmol/L — AB (ref 3.5–5.1)
Sodium: 140 mmol/L (ref 135–145)

## 2016-08-22 LAB — PROTEIN / CREATININE RATIO, URINE
CREATININE, URINE: 51 mg/dL
PROTEIN CREATININE RATIO: 6.9 mg/mg{creat} — AB (ref 0.00–0.15)
Total Protein, Urine: 352 mg/dL

## 2016-08-22 LAB — GLUCOSE, CAPILLARY
GLUCOSE-CAPILLARY: 229 mg/dL — AB (ref 65–99)
GLUCOSE-CAPILLARY: 301 mg/dL — AB (ref 65–99)
GLUCOSE-CAPILLARY: 306 mg/dL — AB (ref 65–99)
GLUCOSE-CAPILLARY: 256 mg/dL — AB (ref 65–99)

## 2016-08-22 LAB — PARATHYROID HORMONE, INTACT (NO CA): PTH: 230 pg/mL — AB (ref 15–65)

## 2016-08-22 MED ORDER — INSULIN ASPART PROT & ASPART (70-30 MIX) 100 UNIT/ML ~~LOC~~ SUSP
26.0000 [IU] | Freq: Two times a day (BID) | SUBCUTANEOUS | Status: DC
  Administered 2016-08-22 – 2016-08-23 (×2): 26 [IU] via SUBCUTANEOUS
  Filled 2016-08-22 (×2): qty 26

## 2016-08-22 MED ORDER — METOPROLOL TARTRATE 50 MG PO TABS
100.0000 mg | ORAL_TABLET | Freq: Two times a day (BID) | ORAL | Status: DC
  Administered 2016-08-22 – 2016-08-25 (×7): 100 mg via ORAL
  Filled 2016-08-22 (×7): qty 2

## 2016-08-22 MED ORDER — CALCITRIOL 0.25 MCG PO CAPS
0.2500 ug | ORAL_CAPSULE | Freq: Every day | ORAL | Status: DC
  Administered 2016-08-22 – 2016-08-25 (×4): 0.25 ug via ORAL
  Filled 2016-08-22 (×4): qty 1

## 2016-08-22 MED ORDER — FUROSEMIDE 10 MG/ML IJ SOLN
20.0000 mg | Freq: Once | INTRAMUSCULAR | Status: AC
  Administered 2016-08-22: 20 mg via INTRAVENOUS
  Filled 2016-08-22: qty 2

## 2016-08-22 MED ORDER — IPRATROPIUM-ALBUTEROL 0.5-2.5 (3) MG/3ML IN SOLN
3.0000 mL | Freq: Four times a day (QID) | RESPIRATORY_TRACT | Status: DC
  Administered 2016-08-22 – 2016-08-23 (×3): 3 mL via RESPIRATORY_TRACT
  Filled 2016-08-22 (×4): qty 3

## 2016-08-22 MED ORDER — AMLODIPINE BESYLATE 5 MG PO TABS
5.0000 mg | ORAL_TABLET | Freq: Every day | ORAL | Status: DC
  Administered 2016-08-22 – 2016-08-25 (×4): 5 mg via ORAL
  Filled 2016-08-22 (×4): qty 1

## 2016-08-22 NOTE — Progress Notes (Signed)
[facility]  ROUNDING NOTE   Subjective:  Renal function has improved today. Creatinine currently down to 2.8. Patient reports that her baseline creatinine was in the twos as well.   Objective:  Vital signs in last 24 hours:  Temp:  [98 F (36.7 C)-99.1 F (37.3 C)] 98 F (36.7 C) (10/28 0752) Pulse Rate:  [87-95] 95 (10/28 0752) Resp:  [18-26] 18 (10/28 0752) BP: (123-185)/(68-97) 185/97 (10/28 0752) SpO2:  [98 %-100 %] 100 % (10/28 0752)  Weight change:  Filed Weights   08/18/16 1832 08/18/16 2310 08/21/16 0814  Weight: 108.9 kg (240 lb) 115.7 kg (255 lb) 115.7 kg (255 lb)    Intake/Output: I/O last 3 completed shifts: In: 1662 [P.O.:120; I.V.:1092; IV Piggyback:450] Out: 850 [Urine:850]   Intake/Output this shift:  Total I/O In: 240 [P.O.:240] Out: -   Physical Exam: General: NAD, resting in bed  Head: Normocephalic, atraumatic. Moist oral mucosal membranes  Eyes: Anicteric  Neck: Supple, trachea midline  Lungs:  Clear to auscultation  Heart: Regular rate and rhythm  Abdomen:  Soft, nontender, bowel sounds present  Extremities: Right foot wound vac  Neurologic: Nonfocal, moving all four extremities  Skin: No lesions  Access: none    Basic Metabolic Panel:  Recent Labs Lab 08/18/16 1857 08/19/16 0521 08/20/16 0455 08/21/16 0326 08/22/16 0608  NA 133* 137 138  --  140  K 3.8 3.7 3.4*  --  3.2*  CL 100* 108 111  --  112*  CO2 20* 20* 18*  --  18*  GLUCOSE 357* 200* 201*  --  240*  BUN 46* 45* 49*  --  42*  CREATININE 3.53* 3.37* 3.46*  --  2.87*  CALCIUM 8.3* 7.7* 8.1*  --  8.6*  PHOS  --   --   --  3.9 3.9    Liver Function Tests:  Recent Labs Lab 08/18/16 1857 08/22/16 0608  AST 21  --   ALT 11*  --   ALKPHOS 66  --   BILITOT 0.3  --   PROT 7.1  --   ALBUMIN 2.6* 2.2*   No results for input(s): LIPASE, AMYLASE in the last 168 hours. No results for input(s): AMMONIA in the last 168 hours.  CBC:  Recent Labs Lab  08/18/16 1857 08/19/16 0521 08/20/16 0455 08/21/16 0326  WBC 17.0* 9.7 9.4 7.4  NEUTROABS 15.1*  --   --   --   HGB 9.1* 8.7* 8.6* 8.6*  HCT 28.1* 25.7* 25.5* 25.4*  MCV 85.8 85.6 84.4 84.7  PLT 285 199 258 260    Cardiac Enzymes: No results for input(s): CKTOTAL, CKMB, CKMBINDEX, TROPONINI in the last 168 hours.  BNP: Invalid input(s): POCBNP  CBG:  Recent Labs Lab 08/20/16 2154 08/21/16 0723 08/21/16 1119 08/21/16 1624 08/21/16 2051  GLUCAP 250* 237* 225* 278* 268*    Microbiology: Results for orders placed or performed during the hospital encounter of 08/18/16  Blood Culture (routine x 2)     Status: None (Preliminary result)   Collection Time: 08/18/16  6:58 PM  Result Value Ref Range Status   Specimen Description BLOOD LEFT ARM  Final   Special Requests   Final    BOTTLES DRAWN AEROBIC AND ANAEROBIC  AER10CC ANA 10CC   Culture NO GROWTH 3 DAYS  Final   Report Status PENDING  Incomplete  Blood Culture (routine x 2)     Status: None (Preliminary result)   Collection Time: 08/18/16  6:58 PM  Result  Value Ref Range Status   Specimen Description BLOOD  RIGHT ARM  Final   Special Requests   Final    BOTTLES DRAWN AEROBIC AND ANAEROBIC  AER 1CC ANA 1CC   Culture NO GROWTH 3 DAYS  Final   Report Status PENDING  Incomplete  Surgical PCR screen     Status: None   Collection Time: 08/18/16 11:15 PM  Result Value Ref Range Status   MRSA, PCR NEGATIVE NEGATIVE Final   Staphylococcus aureus NEGATIVE NEGATIVE Final    Comment:        The Xpert SA Assay (FDA approved for NASAL specimens in patients over 25 years of age), is one component of a comprehensive surveillance program.  Test performance has been validated by [facility] for patients greater than or equal to 21 year old. It is not intended to diagnose infection nor to guide or monitor treatment.   Aerobic/Anaerobic Culture (surgical/deep wound)     Status: None (Preliminary result)   Collection Time:  08/19/16  2:09 PM  Result Value Ref Range Status   Specimen Description WOUND  Final   Special Requests Normal  Final   Gram Stain   Final    RARE WBC PRESENT, PREDOMINANTLY PMN ABUNDANT GRAM POSITIVE COCCI IN PAIRS FEW GRAM NEGATIVE RODS FEW GRAM POSITIVE COCCI IN CLUSTERS Performed at [facility]    Culture   Final    MODERATE VIRIDANS STREPTOCOCCUS FEW ENTEROCOCCUS FAECALIS SUSCEPTIBILITIES TO FOLLOW NO ANAEROBES ISOLATED; CULTURE IN PROGRESS FOR 5 DAYS    Report Status PENDING  Incomplete    Coagulation Studies: No results for input(s): LABPROT, INR in the last 72 hours.  Urinalysis:  Recent Labs  08/22/16 0342  COLORURINE STRAW*  LABSPEC 1.009  PHURINE 5.0  GLUCOSEU >500*  HGBUR 2+*  BILIRUBINUR NEGATIVE  KETONESUR NEGATIVE  PROTEINUR >500*  NITRITE NEGATIVE  LEUKOCYTESUR NEGATIVE      Imaging: No results found.   Medications:     . atorvastatin  10 mg Oral Daily  . FLUoxetine  40 mg Oral Daily  . gabapentin  800 mg Oral BID  . heparin  5,000 Units Subcutaneous Q8H  . insulin aspart  0-5 Units Subcutaneous QHS  . insulin aspart  0-9 Units Subcutaneous TID WC  . insulin aspart protamine- aspart  20 Units Subcutaneous BID WC  . isosorbide mononitrate  120 mg Oral Daily  . LORazepam  1 mg Oral BID  . pantoprazole  40 mg Oral QAC breakfast  . piperacillin-tazobactam (ZOSYN)  IV  3.375 g Intravenous Q8H  . vancomycin  1,250 mg Intravenous Q36H   acetaminophen **OR** acetaminophen, ondansetron **OR** ondansetron (ZOFRAN) IV, oxyCODONE  Assessment/ Plan:  [name] is a 65 y.o. white female with hypertension, hyperlipidemia, diabetes mellitus type II Insulin dependent, coronary artery disease status post CABG, anxiety/depression, cholecystectomy  who was admitted to [facility] on 08/18/2016 for Wound infection [T14.8XXA, L08.9] Cellulitis of right lower extremity [L03.115] Sepsis, due to unspecified organism (HCC) [A41.9] Osteomyelitis of  right foot, unspecified type (HCC) [M86.9]   1. Acute renal failure/chronic kidney disease stage IV: history suggestive that this is chronic kidney disease secondary to diabetic nephropathy and hypertension. No urine studies available.  Patient states baseline Cr in the 2's per nephrologist in [place].   -  Renal function has improved. Creatinine down to 2.87. Patient likely had acute renal failure related to right foot infection..  2. Hypertension: Blood pressure currently 185/97.  Restart patient on amlodipine 5 mg by  mouth daily and metoprolol 100 mg by mouth twice a day.  3. Diabetes mellitus type II: insulin dependent: hemoglobin A1c 6.2%  - low most likely low due to renal failure  4. Anemia of chronic kidney disease: hemoglobin low at 8.6. Iron deficiency.  - Avoid iron for now. Consider Epogen if hemoglobin continues to drop.  5. Secondary Hyperparathyroidism: with hypocalcemia. Phosphorus at goal.  - PTH high at 230. Start patient on calcitriol 0.25 Mike grams by mouth daily.  6. Peripheral vascular disease with diabetic foot ulcer: status post angiogram Dr. Wyn Quakerew on 10/27 and debridement on 10/26 Dr. Orland Jarredroxler - IV antibiotics: zosyn and vancomycin   LOS: 4 Brooke [name] 10/28/201710:54 AM

## 2016-08-22 NOTE — Progress Notes (Signed)
Pt has IV fluids going at 100 ML HR. Pt received IV lasix earlier in shift due to wheezing. Pt Lungs clear now. Primary paged and spoke to Dr. Emmit PomfretHugelmeyer in regard to orders for IV fluids and requested order to be discontinued. Orders received to discontinue fluids. Primary RN to continue to monitor pt.

## 2016-08-22 NOTE — Progress Notes (Signed)
Pt complaining of shortness of breath. Primary RN assessed pt. Pt was found to be with slight expiratory wheezing, Abdominal Distention; Primary MD paged. Primary RN spoke to Dr. Nicole KindredPyredy and notified of pt condition. MD to place order for IV lasix 20 mg once.

## 2016-08-22 NOTE — Progress Notes (Signed)
Called Dr. Emmit PomfretHugelmeyer regarding patient's labored breathing and expiratory wheezing.  Appropriate orders were placed.  Arturo MortonClay, Marisal Swarey N 08/22/2016  8:55 PM

## 2016-08-22 NOTE — Progress Notes (Addendum)
Pharmacy Antibiotic Note  Brooke [name] is a 65 y.o. female admitted on 08/18/2016 with sepsis/Osteomyelitits?.  Pharmacy has been consulted for Zosyn and vancomycin dosing.  Plan: 1. Zosyn 3.375 gm IV Q8H EI 2. Vancomycin 1.5 gm IV x 1 in ED followed in 24 hours (stacked dosing) by vancomycin 1.25 gm IV Q36H, predicted trough 18 mcg/mL. Pharmacy will continue to follow and adjust as needed to maintain trough 15 to 20 mcg/mL. Vd 58.1 L, Ke 0.022 hr-1, T1/2 32 hr  10/28- Scr has improved, but PK still calculates Vanc at 1250 q36h. Follow renal fxn. Wound cx:  Few GNR,    Strept.Viridans-sens Pending,    Enterococcus faecalis- pan sensitive. ? Consider narrowing to Unasyn or PO Augmentin.     Height: 5\' 9"  (175.3 cm) Weight: 255 lb (115.7 kg) IBW/kg (Calculated) : 66.2  Temp (24hrs), Avg:98.5 F (36.9 C), Min:98 F (36.7 C), Max:99.1 F (37.3 C)   Recent Labs Lab 08/18/16 1857 08/19/16 0521 08/20/16 0455 08/21/16 0326 08/22/16 0608  WBC 17.0* 9.7 9.4 7.4  --   CREATININE 3.53* 3.37* 3.46*  --  2.87*  LATICACIDVEN 1.5  --   --   --   --     Estimated Creatinine Clearance: 26.5 mL/min (by C-G formula based on SCr of 2.87 mg/dL (H)).  Will follow CrCl closely, No Known Allergies  Results for orders placed or performed during the hospital encounter of 08/18/16  Blood Culture (routine x 2)     Status: None (Preliminary result)   Collection Time: 08/18/16  6:58 PM  Result Value Ref Range Status   Specimen Description BLOOD LEFT ARM  Final   Special Requests   Final    BOTTLES DRAWN AEROBIC AND ANAEROBIC  AER10CC ANA 10CC   Culture NO GROWTH 3 DAYS  Final   Report Status PENDING  Incomplete  Blood Culture (routine x 2)     Status: None (Preliminary result)   Collection Time: 08/18/16  6:58 PM  Result Value Ref Range Status   Specimen Description BLOOD  RIGHT ARM  Final   Special Requests   Final    BOTTLES DRAWN AEROBIC AND ANAEROBIC  AER 1CC ANA 1CC   Culture NO  GROWTH 3 DAYS  Final   Report Status PENDING  Incomplete  Surgical PCR screen     Status: None   Collection Time: 08/18/16 11:15 PM  Result Value Ref Range Status   MRSA, PCR NEGATIVE NEGATIVE Final   Staphylococcus aureus NEGATIVE NEGATIVE Final    Comment:        The Xpert SA Assay (FDA approved for NASAL specimens in patients over 65 years of age), is one component of a comprehensive surveillance program.  Test performance has been validated by [facility] for patients greater than or equal to 65 year old. It is not intended to diagnose infection nor to guide or monitor treatment.   Aerobic/Anaerobic Culture (surgical/deep wound)     Status: None (Preliminary result)   Collection Time: 08/19/16  2:09 PM  Result Value Ref Range Status   Specimen Description WOUND  Final   Special Requests Normal  Final   Gram Stain   Final    RARE WBC PRESENT, PREDOMINANTLY PMN ABUNDANT GRAM POSITIVE COCCI IN PAIRS FEW GRAM NEGATIVE RODS FEW GRAM POSITIVE COCCI IN CLUSTERS Performed at [facility]    Culture   Final    MODERATE VIRIDANS STREPTOCOCCUS FEW ENTEROCOCCUS FAECALIS NO ANAEROBES ISOLATED; CULTURE IN PROGRESS FOR 5 DAYS  Report Status PENDING  Incomplete   Organism ID, Bacteria ENTEROCOCCUS FAECALIS  Final      Susceptibility   Enterococcus faecalis - MIC*    AMPICILLIN <=2 SENSITIVE Sensitive     VANCOMYCIN 1 SENSITIVE Sensitive     GENTAMICIN SYNERGY SENSITIVE Sensitive     * FEW ENTEROCOCCUS FAECALIS    Thank you for allowing pharmacy to be a part of this patient's care.  Bari MantisKristin Atavia Poppe PharmD Clinical Pharmacist 08/22/2016

## 2016-08-22 NOTE — Care Management Note (Signed)
Case Management Note  Patient Details  Name: Sanjuana Maeatsy Necaise MRN: [id number] Date of Birth: 1951/08/31  Subjective/Objective:     Ms Normand SloopDillard is sleeping. This Clinical research associatewriter attempted to discuss home health providers with Mrs Kirby's sister (Ms Normand [place]Dillard [place] in [place]) Sister reports "Her kidney doctor said that she would be going to rehab." CSW was updated. This Clinical research associatewriter will approach Mrs Normand SloopDillard later when she is awake and alert.                Action/Plan:   Expected Discharge Date:  08/20/16               Expected Discharge Plan:     In-House Referral:     Discharge planning Services     Post Acute Care Choice:    Choice offered to:     DME Arranged:    DME Agency:     HH Arranged:    HH Agency:     Status of Service:     If discussed at [employer] Meetings, dates discussed:    Additional Comments:  Mariaeduarda Defranco A, RN 08/22/2016, 1:53 PM

## 2016-08-22 NOTE — Progress Notes (Signed)
[facility] NAME: Brooke [name]    MR#:  [id number]  DATE OF BIRTH:  February 19, 1951  SUBJECTIVE:   Patient is here due to right diabetic foot infection. Seen by podiatry and status post incision and drainage of the wound 08/20/2016. Postop day #2. Status post angiogram today showing no evidence of peripheral artery diseas Renal function stable. The patient feels good today, denies any pain   REVIEW OF SYSTEMS:    Review of Systems  Constitutional: Positive for chills. Negative for fever and weight loss.  HENT: Negative for congestion, nosebleeds and tinnitus.   Eyes: Negative for blurred vision, double vision and redness.  Respiratory: Negative for cough, hemoptysis and shortness of breath.   Cardiovascular: Negative for chest pain, orthopnea, leg swelling and PND.  Gastrointestinal: Negative for abdominal pain, diarrhea, melena, nausea and vomiting.  Genitourinary: Negative for dysuria, hematuria and urgency.  Musculoskeletal: Positive for joint pain (right foot pain. ). Negative for falls.  Neurological: Negative for dizziness, tingling, sensory change, focal weakness, seizures, weakness and headaches.  Endo/Heme/Allergies: Negative for polydipsia. Does not bruise/bleed easily.  Psychiatric/Behavioral: Negative for depression and memory loss. The patient is not nervous/anxious.     Nutrition: Carb control Tolerating Diet: Yes Tolerating PT: Await Eval.    DRUG ALLERGIES:  No Known Allergies  VITALS:  Blood pressure (!) 185/97, pulse 95, temperature 98 F (36.7 C), temperature source Oral, resp. rate 18, height 5' 9"  (1.753 m), weight 115.7 kg (255 lb), SpO2 100 %.  PHYSICAL EXAMINATION:   Physical Exam  GENERAL:  65 y.o.-year-old obese patient sitting up in bed in no acute distress.  EYES: Pupils equal, round, reactive to light and accommodation. No scleral icterus. Extraocular muscles intact.  HEENT: Head atraumatic,  normocephalic. Oropharynx and nasopharynx clear.  NECK:  Supple, no jugular venous distention. No thyroid enlargement, no tenderness.  LUNGS: Normal breath sounds bilaterally, no wheezing, rales, rhonchi. No use of accessory muscles of respiration.  CARDIOVASCULAR: S1, S2 normal. No murmurs, rubs, or gallops.  ABDOMEN: Soft, nontender, nondistended. Bowel sounds present. No organomegaly or mass.  EXTREMITIES: No cyanosis, clubbing or edema b/l.  Right foot diabetic foot ulcer s/p I & D and wound VAC is on erythema is subsiding. No significant pain on palpation. Swelling has subsided as well  NEUROLOGIC: Cranial nerves II through XII are intact. No focal Motor or sensory deficits b/l.   PSYCHIATRIC: The patient is alert and oriented x 3.  Good affect.  SKIN: No obvious rash, lesion, or ulcer.    LABORATORY PANEL:   CBC  Recent Labs Lab 08/21/16 0326  WBC 7.4  HGB 8.6*  HCT 25.4*  PLT 260   ------------------------------------------------------------------------------------------------------------------  Chemistries   Recent Labs Lab 08/18/16 1857  08/22/16 0608  NA 133*  < > 140  K 3.8  < > 3.2*  CL 100*  < > 112*  CO2 20*  < > 18*  GLUCOSE 357*  < > 240*  BUN 46*  < > 42*  CREATININE 3.53*  < > 2.87*  CALCIUM 8.3*  < > 8.6*  AST 21  --   --   ALT 11*  --   --   ALKPHOS 66  --   --   BILITOT 0.3  --   --   < > = values in this interval not displayed. ------------------------------------------------------------------------------------------------------------------  Cardiac Enzymes No results for input(s): TROPONINI in the last 168 hours. ------------------------------------------------------------------------------------------------------------------  RADIOLOGY:  No  results found.   ASSESSMENT AND PLAN:   65 year old female with past medical history of diabetes, diabetic neuropathy, chronic kidney disease stage III, osteoarthritis, peripheral vascular disease,  coronary artery disease, anxiety who presented to the hospital due to a right foot ulcer with foul-smelling drainage.  1. Sepsis-patient met criteria on admission given the tachycardia, leukocytosis and infected right diabetic foot ulcer. Blood cultures are negative so far - afebrile since admission.  Hemodynamically stable.  We'll culture revealed moderate viridans streptococcus, few Enterococcus faecalis sensitive to ampicillin, gentamicin and vancomycin . Change antibiotic to Augmentin prior to discharge to home  2. Infected right foot diabetic ulcer-cause of patient's sepsis. -Continue IV  Zosyn.Blood cultures are negative so far, wound cultures revealed Streptococcus viridans, enterococcus faecalis -Seen by podiatry and pt. Is s/p I & 08/20/2016 POD # 2. Seen by Vascular and s/p angiogram 10/27/2017and no evidence of PAD.  Await further podiatry input regarding need for possible amputation (vs) treatment with antibiotics.  -MRI showed no evidence of osteomyelitis.   3. CKD Stage III - no baseline Cr. to compare with.  -Appreciate nephrology input. 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 No acute indication for hemodialysis presently. Renal function remains stable after  Angiogram yesterday   4. Diabetes - appreciate Diabetes Coordinator input.  Advance insulin  70/30 and add SSI coverage and monitor.   5. HTN - cont. Imdur.   6. DM Neuropathy - cont. Neurontin.   7. GERD - cont. Protonix.   8. Depression - cont. Prozac.    Dispo-pending further Podiatry, Renal input.   All the records are reviewed and case discussed with Care Management/Social Worker. Management plans discussed with the patient, family and they are in agreement.  CODE STATUS: Full code  DVT Prophylaxis: Heparin SQ  TOTAL TIME TAKING CARE OF THIS PATIENT: 35 minutes.   discussed with patient's family  POSSIBLE D/C IN 1-2 DAYS, DEPENDING ON CLINICAL CONDITION.   Theodoro Grist M.D on  08/22/2016 at 1:33 PM  Between 7am to 6pm - Pager - [phone]  After 6pm go to [email or web address] - [employer] [facility]  Office  [phone]  CC: Primary care physician; No primary care provider on file.

## 2016-08-23 ENCOUNTER — Inpatient Hospital Stay: Payer: Medicare Other

## 2016-08-23 LAB — BASIC METABOLIC PANEL
Anion gap: 9 (ref 5–15)
BUN: 43 mg/dL — AB (ref 6–20)
CHLORIDE: 111 mmol/L (ref 101–111)
CO2: 19 mmol/L — ABNORMAL LOW (ref 22–32)
Calcium: 8.4 mg/dL — ABNORMAL LOW (ref 8.9–10.3)
Creatinine, Ser: 2.88 mg/dL — ABNORMAL HIGH (ref 0.44–1.00)
GFR calc non Af Amer: 16 mL/min — ABNORMAL LOW (ref >60)
GFR, EST AFRICAN AMERICAN: 19 mL/min — AB (ref >60)
Glucose, Bld: 233 mg/dL — ABNORMAL HIGH (ref 65–99)
POTASSIUM: 3.2 mmol/L — AB (ref 3.5–5.1)
SODIUM: 139 mmol/L (ref 135–145)

## 2016-08-23 LAB — CULTURE, BLOOD (ROUTINE X 2)
Culture: NO GROWTH
Culture: NO GROWTH

## 2016-08-23 LAB — CREATININE, SERUM
CREATININE: 2.77 mg/dL — AB (ref 0.44–1.00)
GFR calc Af Amer: 20 mL/min — ABNORMAL LOW (ref >60)
GFR, EST NON AFRICAN AMERICAN: 17 mL/min — AB (ref >60)

## 2016-08-23 LAB — GLUCOSE, CAPILLARY
GLUCOSE-CAPILLARY: 185 mg/dL — AB (ref 65–99)
GLUCOSE-CAPILLARY: 265 mg/dL — AB (ref 65–99)
Glucose-Capillary: 365 mg/dL — ABNORMAL HIGH (ref 65–99)
Glucose-Capillary: 226 mg/dL — ABNORMAL HIGH (ref 65–99)

## 2016-08-23 MED ORDER — FUROSEMIDE 40 MG PO TABS
80.0000 mg | ORAL_TABLET | Freq: Two times a day (BID) | ORAL | Status: DC
  Administered 2016-08-23 – 2016-08-25 (×4): 80 mg via ORAL
  Filled 2016-08-23 (×5): qty 2

## 2016-08-23 MED ORDER — FUROSEMIDE 40 MG PO TABS
40.0000 mg | ORAL_TABLET | Freq: Two times a day (BID) | ORAL | Status: DC
  Administered 2016-08-23: 40 mg via ORAL
  Filled 2016-08-23: qty 1

## 2016-08-23 MED ORDER — INSULIN ASPART PROT & ASPART (70-30 MIX) 100 UNIT/ML ~~LOC~~ SUSP
30.0000 [IU] | Freq: Two times a day (BID) | SUBCUTANEOUS | Status: DC
  Administered 2016-08-23 – 2016-08-25 (×4): 30 [IU] via SUBCUTANEOUS
  Filled 2016-08-23 (×4): qty 30

## 2016-08-23 MED ORDER — AMPICILLIN-SULBACTAM SODIUM 3 (2-1) G IJ SOLR
3.0000 g | Freq: Two times a day (BID) | INTRAMUSCULAR | Status: DC
  Administered 2016-08-23 – 2016-08-25 (×4): 3 g via INTRAVENOUS
  Filled 2016-08-23 (×7): qty 3

## 2016-08-23 NOTE — [place] FL2 (Addendum)
[place] MEDICAID FL2 LEVEL OF CARE SCREENING TOOL     IDENTIFICATION  Patient Name: Brooke [name] Birthdate: Jul 31, 1951 Sex: female Admission Date (Current Location): 08/18/2016  [place]   Facility and Address:  [facility], [address], [place], [place] [address]      Provider Number: [id number]  Attending Physician Name and Address:  Katharina Caperima Vaickute, MD  Relative Name and Phone Number:       Current Level of Care: Hospital Recommended Level of Care: Skilled Nursing Facility Prior Approval Number:    Date Approved/Denied:   PASRR Number: [id number]  Discharge Plan: SNF    Current Diagnoses: Patient Active Problem List   Diagnosis Date Noted  . Sepsis (HCC) 08/18/2016  . Diabetes mellitus with foot ulcer (HCC) 08/18/2016  . HTN (hypertension) 08/18/2016  . CAD (coronary artery disease) 08/18/2016  . AKI (acute kidney injury) (HCC) 08/18/2016  . Anxiety 08/18/2016  . GERD (gastroesophageal reflux disease) 08/18/2016  . HLD (hyperlipidemia) 08/18/2016    Orientation RESPIRATION BLADDER Height & Weight     Self, Time, Situation, Place  Normal Continent Weight: 255 lb (115.7 kg) Height:  5\' 9"  (175.3 cm)  BEHAVIORAL SYMPTOMS/MOOD NEUROLOGICAL BOWEL NUTRITION STATUS      Continent  Carb Modfied  AMBULATORY STATUS COMMUNICATION OF NEEDS Skin   Extensive Assist Verbally Surgical wounds                       Personal Care Assistance Level of Assistance  Bathing, Dressing Bathing Assistance: Limited assistance   Dressing Assistance: Limited assistance     Functional Limitations Info             SPECIAL CARE FACTORS FREQUENCY  PT (By licensed PT)     PT Frequency: up to 5X per day 5X per week              Contractures Contractures Info: Present    Additional Factors Info    Wound Care:   currently has a wound vac (placed on 08/21/16) Most likely will need IV antibiotics              Current Medications (08/23/2016):  This is the current hospital active medication list Current Facility-Administered Medications  Medication Dose Route Frequency Provider Last Rate Last Dose  . acetaminophen (TYLENOL) tablet 650 mg  650 mg Oral Q6H PRN Oralia Manisavid Willis, MD       Or  . acetaminophen (TYLENOL) suppository 650 mg  650 mg Rectal Q6H PRN Oralia Manisavid Willis, MD      . amLODipine (NORVASC) tablet 5 mg  5 mg Oral Daily Munsoor Lateef, MD   5 mg at 08/23/16 0852  . atorvastatin (LIPITOR) tablet 10 mg  10 mg Oral Daily Oralia Manisavid Willis, MD   10 mg at 08/23/16 0853  . calcitRIOL (ROCALTROL) capsule 0.25 mcg  0.25 mcg Oral Daily Munsoor Lateef, MD   0.25 mcg at 08/23/16 0852  . FLUoxetine (PROZAC) capsule 40 mg  40 mg Oral Daily Oralia Manisavid Willis, MD   40 mg at 08/23/16 0853  . furosemide (LASIX) tablet 80 mg  80 mg Oral BID Katharina Caperima Vaickute, MD      . gabapentin (NEURONTIN) capsule 800 mg  800 mg Oral BID Oralia Manisavid Willis, MD   800 mg at 08/23/16 0854  . heparin injection 5,000 Units  5,000 Units Subcutaneous Q8H Oralia Manisavid Willis, MD   5,000 Units at 08/23/16 1354  . insulin aspart (novoLOG) injection  0-5 Units  0-5 Units Subcutaneous QHS Houston SirenVivek J Sainani, MD   3 Units at 08/22/16 2140  . insulin aspart (novoLOG) injection 0-9 Units  0-9 Units Subcutaneous TID WC Houston SirenVivek J Sainani, MD   5 Units at 08/23/16 1140  . insulin aspart protamine- aspart (NOVOLOG MIX 70/30) injection 30 Units  30 Units Subcutaneous BID WC Katharina Caperima Vaickute, MD      . ipratropium-albuterol (DUONEB) 0.5-2.5 (3) MG/3ML nebulizer solution 3 mL  3 mL Nebulization Q6H Alexis Hugelmeyer, DO   3 mL at 08/23/16 0746  . isosorbide mononitrate (IMDUR) 24 hr tablet 120 mg  120 mg Oral Daily Oralia Manisavid Willis, MD   120 mg at 08/23/16 0850  . LORazepam (ATIVAN) tablet 1 mg  1 mg Oral BID Oralia Manisavid Willis, MD   1 mg at 08/23/16 [id number]  . metoprolol (LOPRESSOR) tablet 100 mg  100 mg Oral BID Munsoor Lateef, MD   100 mg at 08/23/16 0852  . ondansetron (ZOFRAN) tablet  4 mg  4 mg Oral Q6H PRN Oralia Manisavid Willis, MD       Or  . ondansetron [facility](ZOFRAN) injection 4 mg  4 mg Intravenous Q6H PRN Oralia Manisavid Willis, MD      . oxyCODONE (Oxy IR/ROXICODONE) immediate release tablet 5 mg  5 mg Oral Q4H PRN Houston SirenVivek J Sainani, MD   5 mg at 08/22/16 2308  . pantoprazole (PROTONIX) EC tablet 40 mg  40 mg Oral QAC breakfast Oralia Manisavid Willis, MD   40 mg at 08/23/16 0851  . piperacillin-tazobactam (ZOSYN) IVPB 3.375 g  3.375 g Intravenous Q8H Oralia Manisavid Willis, MD   3.375 g at 08/23/16 [id number]     Discharge Medications: Please see discharge summary for a list of discharge medications.  Relevant Imaging Results:  Relevant Lab Results:   Additional Information  SS # [id number]  Judi CongKaren M White, LCSW

## 2016-08-23 NOTE — Evaluation (Signed)
Physical Therapy Evaluation Patient Details Name: Brooke [name] MRN: [id number] DOB: 1951-04-17 Today's Date: 08/23/2016   History of Present Illness  Patient is a 65 y/o female that presents with R diabetic foot infection s/p angiogram with debridement of necrotic ulceration and wound vac in place currently.   Clinical Impression  Patient has a history of diabetic neuropathy bilaterally, sustained open wound to RLE and developed necrotic ulceration, being managed with wound vac and s/p angiogram. She is currently NWBing on RLE and must maintain wound vac with all mobility. She is able to perform ROM exercises on RLE, though unable to feel RLE, which is normal for her. She is not able to hop in this session, thus gait is deferred. She is able to pivot on her LLE with a RW with no overt LOB though unsteady. Would recommend SNF to address mobility deficits due to wound vac and WBing precautions.     Follow Up Recommendations SNF    Equipment Recommendations  Rolling walker with 5" wheels    Recommendations for Other Services       Precautions / Restrictions Precautions Precautions: Fall Restrictions Weight Bearing Restrictions: Yes RLE Weight Bearing: Non weight bearing      Mobility  Bed Mobility Overal bed mobility: Modified Independent             General bed mobility comments: HOB elevated, however patient is able to complete supine to sit transfer without assistance from therapist.   Transfers Overall transfer level: Needs assistance Equipment used: Rolling walker (2 wheeled) Transfers: Sit to/from Stand Sit to Stand: Min guard;Min assist         General transfer comment: Patient educated on hand placement with RW, able to perform sit to stand with balance assist and minor assist for bringing herself off bed surface.   Ambulation/Gait             General Gait Details: Performed only pivoting manuever on LLE, no hopping or ambulation. She was able to  maintain NWB, though attempted to weight bear through heel on RLE, PT educated to maintain NWB  Stairs            Wheelchair Mobility    Modified Rankin (Stroke Patients Only)       Balance Overall balance assessment: Needs assistance Sitting-balance support: Bilateral upper extremity supported Sitting balance-Leahy Scale: Good     Standing balance support: Bilateral upper extremity supported Standing balance-Leahy Scale: Fair                               Pertinent Vitals/Pain Pain Assessment: No/denies pain (Patient reports she cannot feel her feet.)    Home Living Family/patient expects to be discharged to:: Private residence Living Arrangements: Alone Available Help at Discharge: Available PRN/intermittently;Family Type of Home: House Home Access: Stairs to enter Entrance Stairs-Rails: Can reach both Entrance Stairs-Number of Steps: 2 Home Layout: One level Home Equipment: Walker - 2 wheels;Walker - 4 wheels;Cane - single point      Prior Function Level of Independence: Independent         Comments: Patient was able to live independently prior to this admission with no use of ADs.      Hand Dominance        Extremity/Trunk Assessment   Upper Extremity Assessment: Overall WFL for tasks assessed (Able to DF/PF, flex/extend toes on RLE)           Lower Extremity Assessment:  Overall WFL for tasks assessed         Communication   Communication: No difficulties  Cognition Arousal/Alertness: Awake/alert Behavior During Therapy: WFL for tasks assessed/performed Overall Cognitive Status: Within Functional Limits for tasks assessed                      General Comments General comments (skin integrity, edema, etc.): Redness/cellulitis over R foot (laterally) wound vac in place.     Exercises General Exercises - Lower Extremity Ankle Circles/Pumps: AROM;Both;10 reps Long Arc Quad: AROM;Both;15 reps Hip Flexion/Marching:  AROM;Both;15 reps   Assessment/Plan    PT Assessment Patient needs continued PT services  PT Problem List Decreased strength;Decreased balance;Decreased activity tolerance;Decreased mobility;Decreased knowledge of precautions;Impaired sensation;Decreased skin integrity;Decreased knowledge of use of DME          PT Treatment Interventions DME instruction;Functional mobility training;Therapeutic activities;Stair training;Gait training;Therapeutic exercise;Balance training;Neuromuscular re-education    PT Goals (Current goals can be found in the Care Plan section)  Acute Rehab PT Goals Patient Stated Goal: To return home safely  PT Goal Formulation: With patient Time For Goal Achievement: 09/06/16 Potential to Achieve Goals: Good    Frequency 7X/week   Barriers to discharge Decreased caregiver support Patient lives alone, cannot manage wound vac and NWB status    Co-evaluation               End of Session Equipment Utilized During Treatment: Gait belt Activity Tolerance: Patient tolerated treatment well Patient left: in chair;with call bell/phone within reach;with chair alarm set Nurse Communication: Mobility status         Time: [id number] PT Time Calculation (min) (ACUTE ONLY): 16 min   Charges:   PT Evaluation $PT Eval Moderate Complexity: 1 Procedure     PT G Codes:       Kerin RansomPatrick A Gaige Fussner, PT, DPT    08/23/2016, 3:01 PM

## 2016-08-23 NOTE — Care Management Note (Addendum)
Case Management Note  Patient Details  Name: Brooke [name] MRN: [id number] Date of Birth: [birth year]  Subjective/Objective:  65yo Brooke Brooke [name] was visiting [name] friend in [place] when she became febrile and was admitted with [name] right foot infection/cellulitis. After I&D of right foot wound by Dr Wyn Quakerew [name] wound vac was applied on 08/21/16.. Brooke [name] resides alone in an apartment in [place]. Her PCP=Dr Lorie ApleyBrian Mitchell at [facility] in [place]. Pharmacy=[facility] in [place]. If Brooke [name] is discharged to home she has [name] sister in [place], [place] [name], ph: [phone], who will transport her home. Brooke [name] reports that she would prefer to be discharged to "[name] around here."  Brooke [name] has [name] RW, Medical laboratory scientific officerCane, rollator, [name] CPAP machine, and uses noctural only oxygen supplied by [facility] service in [place]. Brooke [name] drives herself to appointments. Brooke [name] chose [facility] to be her home health provider if she is discharged home.   [facility], [address], [address], [place], [id number]. Ph: [phone].                 Action/Plan:   Expected Discharge Date:  08/20/16               Expected Discharge Plan:     In-House Referral:     Discharge planning Services     Post Acute Care Choice:    Choice offered to:     DME Arranged:    DME Agency:     HH Arranged:    HH Agency:     Status of Service:     If discussed at [employer] of [employer] Meetings, dates discussed:    Additional Comments:  Brooke Folds A, RN 08/23/2016, 2:20 PM

## 2016-08-23 NOTE — Care Management Important Message (Signed)
Important Message  Patient Details  Name: Brooke [name] MRN: [id number] Date of Birth: [birth year]   Medicare Important Message Given:  Yes    Knight Oelkers A, RN 08/23/2016, 3:05 PM

## 2016-08-23 NOTE — Clinical Social Work Note (Signed)
Clinical Social Work Assessment  Patient Details  Name: Brooke [name] MRN: [id number] Date of Birth: 03-06-1951  Date of referral:  08/23/16               Reason for consult:  Facility Placement                Permission sought to share information with:    Permission granted to share information::  Yes, Verbal Permission Granted  Name::        Agency::     Relationship::     Contact Information:     Housing/Transportation Living arrangements for the past 2 months:  Single Family Home Source of Information:  Patient, Medical Team Patient Interpreter Needed:  None Criminal Activity/Legal Involvement Pertinent to Current Situation/Hospitalization:  No - Comment as needed Significant Relationships:  Merchandiser, retailCommunity Support, Siblings Lives with:  Self Do you feel safe going back to the place where you live?  Yes Need for family participation in patient care:  No (Coment)  Care giving concerns:  STR   Social Worker assessment / plan:  CSW visited patient to discuss dc planning. The [place] lives in [place], [place] and was visiting friends in the area. The patient would like to stay in the area for STR and gave verbal permission to conduct a SNF bed search.  The patient is baseline is independent with ADLs and IADLs. The patient indicated that her sister can transport her home post-SNF stay.  Employment status:  Retired Database administratornsurance information:  Managed Medicare PT Recommendations:  Skilled Nursing Facility Information / Referral to community resources:  Skilled Nursing Facility  Patient/Family's Response to care:  Patient was thankful for CSW service. She also commented on her overall stay as being "wonderful".  Patient/Family's Understanding of and Emotional Response to Diagnosis, Current Treatment, and Prognosis:  The patient understands and is in agreement with the dc plan.  Emotional Assessment Appearance:  Appears older than stated age Attitude/Demeanor/Rapport:   (Very  pleasant) Affect (typically observed):  Appropriate, Pleasant Orientation:  Oriented to Self, Oriented to Place, Oriented to  Time, Oriented to Situation Alcohol / Substance use:  Never Used Psych involvement (Current and /or in the community):  No (Comment)  Discharge Needs  Concerns to be addressed:  Discharge Planning Concerns Readmission within the last 30 days:  Yes Current discharge risk:  None Barriers to Discharge:  Continued Medical Work up   [employer] CorporationKaren M Bellamy Rubey, LCSW 08/23/2016, 3:49 PM

## 2016-08-23 NOTE — Progress Notes (Signed)
[facility]  ROUNDING NOTE   Subjective:  Patient appears to be improved. Creatinine down to 2.7 which is closer to her stated baseline. Wound VAC was changed in the right foot today.   Objective:  Vital signs in last 24 hours:  Temp:  [97.9 F (36.6 C)-99.4 F (37.4 C)] 97.9 F (36.6 C) (10/29 0754) Pulse Rate:  [69-122] 73 (10/29 0754) Resp:  [18-83] 19 (10/29 0754) BP: (143-157)/(62-76) 149/62 (10/29 0754) SpO2:  [95 %-99 %] 99 % (10/29 0754)  Weight change:  Filed Weights   08/18/16 1832 08/18/16 2310 08/21/16 0814  Weight: 108.9 kg (240 lb) 115.7 kg (255 lb) 115.7 kg (255 lb)    Intake/Output: I/O last 3 completed shifts: In: 920 [P.O.:720; IV Piggyback:200] Out: 550 [Urine:550]   Intake/Output this shift:  Total I/O In: 240 [P.O.:240] Out: -   Physical Exam: General: NAD, resting in bed  Head: Normocephalic, atraumatic. Moist oral mucosal membranes  Eyes: Anicteric  Neck: Supple, trachea midline  Lungs:  Clear to auscultation  Heart: Regular rate and rhythm  Abdomen:  Soft, nontender, bowel sounds present  Extremities: Right foot wound vac, 1+ bilateral lower extremity edema   Neurologic: Nonfocal, moving all four extremities  Skin: No lesions  Access: none    Basic Metabolic Panel:  Recent Labs Lab 08/18/16 1857 08/19/16 0521 08/20/16 0455 08/21/16 0326 08/22/16 0608 08/22/16 2355 08/23/16 0258  NA 133* 137 138  --  140 139  --   K 3.8 3.7 3.4*  --  3.2* 3.2*  --   CL 100* 108 111  --  112* 111  --   CO2 20* 20* 18*  --  18* 19*  --   GLUCOSE 357* 200* 201*  --  240* 233*  --   BUN 46* 45* 49*  --  42* 43*  --   CREATININE 3.53* 3.37* 3.46*  --  2.87* 2.88* 2.77*  CALCIUM 8.3* 7.7* 8.1*  --  8.6* 8.4*  --   PHOS  --   --   --  3.9 3.9  --   --     Liver Function Tests:  Recent Labs Lab 08/18/16 1857 08/22/16 0608  AST 21  --   ALT 11*  --   ALKPHOS 66  --   BILITOT 0.3  --   PROT 7.1  --   ALBUMIN 2.6* 2.2*   No  results for input(s): LIPASE, AMYLASE in the last 168 hours. No results for input(s): AMMONIA in the last 168 hours.  CBC:  Recent Labs Lab 08/18/16 1857 08/19/16 0521 08/20/16 0455 08/21/16 0326  WBC 17.0* 9.7 9.4 7.4  NEUTROABS 15.1*  --   --   --   HGB 9.1* 8.7* 8.6* 8.6*  HCT 28.1* 25.7* 25.5* 25.4*  MCV 85.8 85.6 84.4 84.7  PLT 285 199 258 260    Cardiac Enzymes: No results for input(s): CKTOTAL, CKMB, CKMBINDEX, TROPONINI in the last 168 hours.  BNP: Invalid input(s): POCBNP  CBG:  Recent Labs Lab 08/22/16 1134 08/22/16 1644 08/22/16 2056 08/23/16 0758 08/23/16 1114  GLUCAP 301* 306* 256* 185* 265*    Microbiology: Results for orders placed or performed during the hospital encounter of 08/18/16  Blood Culture (routine x 2)     Status: None   Collection Time: 08/18/16  6:58 PM  Result Value Ref Range Status   Specimen Description BLOOD LEFT ARM  Final   Special Requests   Final    BOTTLES DRAWN AEROBIC  AND ANAEROBIC  AER10CC ANA 10CC   Culture NO GROWTH 5 DAYS  Final   Report Status 08/23/2016 FINAL  Final  Blood Culture (routine x 2)     Status: None   Collection Time: 08/18/16  6:58 PM  Result Value Ref Range Status   Specimen Description BLOOD  RIGHT ARM  Final   Special Requests   Final    BOTTLES DRAWN AEROBIC AND ANAEROBIC  AER 1CC ANA 1CC   Culture NO GROWTH 5 DAYS  Final   Report Status 08/23/2016 FINAL  Final  Surgical PCR screen     Status: None   Collection Time: 08/18/16 11:15 PM  Result Value Ref Range Status   MRSA, PCR NEGATIVE NEGATIVE Final   Staphylococcus aureus NEGATIVE NEGATIVE Final    Comment:        The Xpert SA Assay (FDA approved for NASAL specimens in patients over 65 years of age), is one component of a comprehensive surveillance program.  Test performance has been validated by [facility] for patients greater than or equal to 65 year old. It is not intended to diagnose infection nor to guide or monitor  treatment.   Aerobic/Anaerobic Culture (surgical/deep wound)     Status: None (Preliminary result)   Collection Time: 08/19/16  2:09 PM  Result Value Ref Range Status   Specimen Description WOUND  Final   Special Requests Normal  Final   Gram Stain   Final    RARE WBC PRESENT, PREDOMINANTLY PMN ABUNDANT GRAM POSITIVE COCCI IN PAIRS FEW GRAM NEGATIVE RODS FEW GRAM POSITIVE COCCI IN CLUSTERS Performed at [facility]    Culture   Final    MODERATE VIRIDANS STREPTOCOCCUS FEW ENTEROCOCCUS FAECALIS NO ANAEROBES ISOLATED; CULTURE IN PROGRESS FOR 5 DAYS    Report Status PENDING  Incomplete   Organism ID, Bacteria ENTEROCOCCUS FAECALIS  Final      Susceptibility   Enterococcus faecalis - MIC*    AMPICILLIN <=2 SENSITIVE Sensitive     VANCOMYCIN 1 SENSITIVE Sensitive     GENTAMICIN SYNERGY SENSITIVE Sensitive     * FEW ENTEROCOCCUS FAECALIS    Coagulation Studies: No results for input(s): LABPROT, INR in the last 72 hours.  Urinalysis:  Recent Labs  08/22/16 0342  COLORURINE STRAW*  LABSPEC 1.009  PHURINE 5.0  GLUCOSEU >500*  HGBUR 2+*  BILIRUBINUR NEGATIVE  KETONESUR NEGATIVE  PROTEINUR >500*  NITRITE NEGATIVE  LEUKOCYTESUR NEGATIVE      Imaging: Dg Chest Port 1 View  Result Date: 08/23/2016 CLINICAL DATA:  Shortness of breath EXAM: PORTABLE CHEST 1 VIEW COMPARISON:  None. FINDINGS: Pulmonary vascular congestion. No frank interstitial edema. Mild left basilar opacity, likely atelectasis. No pleural effusion or pneumothorax. Cardiomegaly.  Postsurgical changes related to prior CABG. Median sternotomy. IMPRESSION: Pulmonary vascular congestion, without frank interstitial edema. Mild left basilar opacity, likely atelectasis. Electronically Signed   By: Charline BillsSriyesh  Krishnan M.D.   On: 08/23/2016 09:30     Medications:     . amLODipine  5 mg Oral Daily  . atorvastatin  10 mg Oral Daily  . calcitRIOL  0.25 mcg Oral Daily  . FLUoxetine  40 mg Oral Daily  .  furosemide  80 mg Oral BID  . gabapentin  800 mg Oral BID  . heparin  5,000 Units Subcutaneous Q8H  . insulin aspart  0-5 Units Subcutaneous QHS  . insulin aspart  0-9 Units Subcutaneous TID WC  . insulin aspart protamine- aspart  30 Units Subcutaneous BID WC  .  ipratropium-albuterol  3 mL Nebulization Q6H  . isosorbide mononitrate  120 mg Oral Daily  . LORazepam  1 mg Oral BID  . metoprolol tartrate  100 mg Oral BID  . pantoprazole  40 mg Oral QAC breakfast  . piperacillin-tazobactam (ZOSYN)  IV  3.375 g Intravenous Q8H   acetaminophen **OR** acetaminophen, ondansetron **OR** ondansetron (ZOFRAN) IV, oxyCODONE  Assessment/ Plan:  [name] is a 65 y.o. white female with hypertension, hyperlipidemia, diabetes mellitus type II Insulin dependent, coronary artery disease status post CABG, anxiety/depression, cholecystectomy  who was admitted to [facility]18[facility] for Wound infection [T14.8XXA, L08.9] Cellulitis of right lower extremity [L03.115] Sepsis, due to unspecified organism (HCC) [A41.9] Osteomyelitis of right foot, unspecified type (HCC) [M86.9]   1. Acute renal failure/chronic kidney disease stage IV: history suggestive that this is chronic kidney disease secondary to diabetic nephropathy and hypertension. No urine studies available.  Patient states baseline Cr in the 2's per nephrologist in [place].   -  Renal function continues to improve. Creatinine down to 2.7. Patient has been started on Lasix. Recommend follow renal function closely while she remains on high-dose Lasix.  2. Hypertension: Blood pressure improved after the addition of amlodipine and metoprolol. Blood pressure currently 149/62.  3. Diabetes mellitus type II: insulin dependent: hemoglobin A1c 6.2%  - low most likely low due to renal failure  4. Anemia of chronic kidney disease: hemoglobin low at 8.6. Iron deficiency.  - Avoid iron for now. Consider Epogen if hemoglobin continues to drop.  5.  Secondary Hyperparathyroidism: with hypocalcemia. Phosphorus at goal.  - PTH high at 230. Continue calcitriol 0.25 g by mouth daily.  6. Peripheral vascular disease with diabetic foot ulcer: status post angiogram Dr. Wyn Quaker on 10/27 and debridement on 10/26 Dr. Orland Jarred - IV antibiotics: zosyn and vancomycin   LOS: 5 Sha Amer 10/29/20171:08 PM

## 2016-08-23 NOTE — Progress Notes (Signed)
[place] [facility] [place] NAME: Brooke [name]    MR#:  [id number]  DATE OF BIRTH:  [birth year]  SUBJECTIVE:   Patient is here due to right diabetic foot infection. Seen by podiatry and status post incision and drainage of the wound 08/20/2016. Postop day #3. Status post angiogram today showing no evidence of peripheral artery diseas Renal function stable. The patient feels good today, denies any pain. Redness, erythema, swelling has been subsiding daily. Patient had wheezing and shortness of breath yesterday, necessitating Lasix and DuoNeb administration. Chest x-ray today revealed pulmonary vascular congestion without frank interstitial edema, atelectasis in the left base.    REVIEW OF SYSTEMS:    Review of Systems  Constitutional: Positive for chills. Negative for fever and weight loss.  HENT: Negative for congestion, nosebleeds and tinnitus.   Eyes: Negative for blurred vision, double vision and redness.  Respiratory: Negative for cough, hemoptysis and shortness of breath.   Cardiovascular: Negative for chest pain, orthopnea, leg swelling and PND.  Gastrointestinal: Negative for abdominal pain, diarrhea, melena, nausea and vomiting.  Genitourinary: Negative for dysuria, hematuria and urgency.  Musculoskeletal: Positive for joint pain (right foot pain. ). Negative for falls.  Neurological: Negative for dizziness, tingling, sensory change, focal weakness, seizures, weakness and headaches.  Endo/Heme/Allergies: Negative for polydipsia. Does not bruise/bleed easily.  Psychiatric/Behavioral: Negative for depression and memory loss. The patient is not nervous/anxious.     Nutrition: Carb control Tolerating Diet: Yes Tolerating PT: Await Eval.    DRUG ALLERGIES:  No Known Allergies  VITALS:  Blood pressure (!) 149/62, pulse 73, temperature 97.9 F (36.6 C), temperature source Oral, resp. rate 19, height _0  (1.753 m), weight 115.7 kg (255 lb),  SpO2 99 %.  PHYSICAL EXAMINATION:   Physical Exam  GENERAL:  65 y.o.-year-old obese patient sitting up in bed in no acute distress.  EYES: Pupils equal, round, reactive to light and accommodation. No scleral icterus. Extraocular muscles intact.  HEENT: Head atraumatic, normocephalic. Oropharynx and nasopharynx clear.  NECK:  Supple, no jugular venous distention. No thyroid enlargement, no tenderness.  LUNGS: Some diminished breath sounds bilaterally, no wheezing, rales, rhonchi. No use of accessory muscles of respiration.  CARDIOVASCULAR: S1, S2 normal. No murmurs, rubs, or gallops.  ABDOMEN: Soft, nontender, nondistended. Bowel sounds present. No organomegaly or mass.  EXTREMITIES: No cyanosis, clubbing or edema b/l.  Right foot diabetic foot ulcer s/p I & D and wound VAC is on erythema is subsiding. No significant pain on palpation. Swelling has subsided as well  NEUROLOGIC: Cranial nerves II through XII are intact. No focal Motor or sensory deficits b/l.   PSYCHIATRIC: The patient is alert and oriented x 3.  Good affect.  SKIN: No obvious rash, lesion, or ulcer.    LABORATORY PANEL:   CBC  Recent Labs Lab 08/21/16 0326  WBC 7.4  HGB 8.6*  HCT 25.4*  PLT 260   ------------------------------------------------------------------------------------------------------------------  Chemistries   Recent Labs Lab 08/18/16 1857  08/22/16 2355 08/23/16 0258  NA 133*  < > 139  --   K 3.8  < > 3.2*  --   CL 100*  < > 111  --   CO2 20*  < > 19*  --   GLUCOSE 357*  < > 233*  --   BUN 46*  < > 43*  --   CREATININE 3.53*  < > 2.88* 2.77*  CALCIUM 8.3*  < > 8.4*  --   AST 21  --   --   --  ALT 11*  --   --   --   ALKPHOS 66  --   --   --   BILITOT 0.3  --   --   --   < > = values in this interval not displayed. ------------------------------------------------------------------------------------------------------------------  Cardiac Enzymes No results for input(s): TROPONINI  in the last 168 hours. ------------------------------------------------------------------------------------------------------------------  RADIOLOGY:  Dg Chest Port 1 View  Result Date: 08/23/2016 CLINICAL DATA:  Shortness of breath EXAM: PORTABLE CHEST 1 VIEW COMPARISON:  None. FINDINGS: Pulmonary vascular congestion. No frank interstitial edema. Mild left basilar opacity, likely atelectasis. No pleural effusion or pneumothorax. Cardiomegaly.  Postsurgical changes related to prior CABG. Median sternotomy. IMPRESSION: Pulmonary vascular congestion, without frank interstitial edema. Mild left basilar opacity, likely atelectasis. Electronically Signed   By: Julian Hy M.D.   On: 08/23/2016 09:30     ASSESSMENT AND PLAN:   65 year old female with past medical history of diabetes, diabetic neuropathy, chronic kidney disease stage III, osteoarthritis, peripheral vascular disease, coronary artery disease, anxiety who presented to the hospital due to a right foot ulcer with foul-smelling drainage.  1. Sepsis-patient met criteria on admission given the tachycardia, leukocytosis and infected right diabetic foot ulcer. Blood cultures are negative so far - afebrile since admission.  Hemodynamically stable.  We'll culture revealed moderate viridans streptococcus, few Enterococcus faecalis sensitive to ampicillin, gentamicin and vancomycin . Change antibiotic to Augmentin prior to discharge to home, continue Zosyn for now, discontinue vancomycin  2. Infected right foot diabetic ulcer-cause of patient's sepsis. -Continue IV  Zosyn.Blood cultures are negative so far, wound cultures revealed Streptococcus viridans, enterococcus faecalis -Seen by podiatry and pt. Is s/p I & 08/20/2016 POD # 3. Seen by Vascular and s/p angiogram 10/27/2017and no evidence of PAD.  Await further podiatry input regarding need for possible amputation (vs) treatment with antibiotics.  -MRI showed no evidence of osteomyelitis.    3. CKD Stage III - no baseline Cr. to compare with.  -Appreciate nephrology input. 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 No acute indication for hemodialysis presently. Renal function remains stable after  Angiogram   4. Diabetes - appreciate Diabetes Coordinator input.  Advance insulin  70/30 even higher and add SSI coverage and monitor.   5. HTN - cont. Imdur.   6. DM Neuropathy - cont. Neurontin.   7. GERD - cont. Protonix.   8. Depression - cont. Prozac.    9. Dyspnea, congestive heart failure, likely acute on chronic systolic, initiate patient on Lasix orally, home doses   Dispo-pending further Podiatry, Renal input. Getting physical therapist evaluation to rule out need to skilled nursing facility for rehabilitation  All the records are reviewed and case discussed with Care Management/Social Worker. Management plans discussed with the patient, family and they are in agreement.  CODE STATUS: Full code  DVT Prophylaxis: Heparin SQ  TOTAL TIME TAKING CARE OF THIS PATIENT: 40 minutes.   discussed with patient's family  POSSIBLE D/C IN 1-2 DAYS, DEPENDING ON CLINICAL CONDITION.   Theodoro Grist M.D on 08/23/2016 at 11:32 AM  Between 7am to 6pm - Pager - [phone]  After 6pm go to [email or web address] - [employer] [facility]  Office  [phone]  CC: Primary care physician; No primary care provider on file.

## 2016-08-24 ENCOUNTER — Encounter: Payer: Self-pay | Admitting: Vascular Surgery

## 2016-08-24 LAB — CBC
HCT: 23.3 % — ABNORMAL LOW (ref 35.0–47.0)
HEMOGLOBIN: 8 g/dL — AB (ref 12.0–16.0)
MCH: 28.7 pg (ref 26.0–34.0)
MCHC: 34.2 g/dL (ref 32.0–36.0)
MCV: 84 fL (ref 80.0–100.0)
Platelets: 314 10*3/uL (ref 150–440)
RBC: 2.77 MIL/uL — AB (ref 3.80–5.20)
RDW: 14.3 % (ref 11.5–14.5)
WBC: 6.3 10*3/uL (ref 3.6–11.0)

## 2016-08-24 LAB — AEROBIC/ANAEROBIC CULTURE W GRAM STAIN (SURGICAL/DEEP WOUND)

## 2016-08-24 LAB — BASIC METABOLIC PANEL
Anion gap: 7 (ref 5–15)
BUN: 44 mg/dL — ABNORMAL HIGH (ref 6–20)
CHLORIDE: 112 mmol/L — AB (ref 101–111)
CO2: 20 mmol/L — ABNORMAL LOW (ref 22–32)
CREATININE: 2.89 mg/dL — AB (ref 0.44–1.00)
Calcium: 8.4 mg/dL — ABNORMAL LOW (ref 8.9–10.3)
GFR calc non Af Amer: 16 mL/min — ABNORMAL LOW (ref >60)
GFR, EST AFRICAN AMERICAN: 19 mL/min — AB (ref >60)
Glucose, Bld: 197 mg/dL — ABNORMAL HIGH (ref 65–99)
POTASSIUM: 3.3 mmol/L — AB (ref 3.5–5.1)
SODIUM: 139 mmol/L (ref 135–145)

## 2016-08-24 LAB — GLUCOSE, CAPILLARY
GLUCOSE-CAPILLARY: 220 mg/dL — AB (ref 65–99)
GLUCOSE-CAPILLARY: 290 mg/dL — AB (ref 65–99)
Glucose-Capillary: 208 mg/dL — ABNORMAL HIGH (ref 65–99)
Glucose-Capillary: 160 mg/dL — ABNORMAL HIGH (ref 65–99)

## 2016-08-24 LAB — AEROBIC/ANAEROBIC CULTURE (SURGICAL/DEEP WOUND): SPECIAL REQUESTS: NORMAL

## 2016-08-24 MED ORDER — IPRATROPIUM-ALBUTEROL 0.5-2.5 (3) MG/3ML IN SOLN: 3.0000 mL | Freq: Four times a day (QID) | RESPIRATORY_TRACT | Status: DC | PRN

## 2016-08-24 MED ORDER — POTASSIUM CHLORIDE CRYS ER 20 MEQ PO TBCR
20.0000 meq | EXTENDED_RELEASE_TABLET | Freq: Once | ORAL | Status: AC
  Administered 2016-08-24: 20 meq via ORAL
  Filled 2016-08-24: qty 1

## 2016-08-24 NOTE — Progress Notes (Signed)
Physical Therapy Treatment Patient Details Name: Brooke [name] MRN: [id number] DOB: January 03, 1951 Today's Date: 08/24/2016    History of Present Illness Patient is a 65 y/o female that presents with R diabetic foot infection s/p angiogram with debridement of necrotic ulceration and wound vac in place currently.     PT Comments    Pt up in chair and agreeable to PT; denies pain in Right lower extremity. Pt states she is able to put weight through Right lower extremity for stand and ambulation; called nursing to clarify who notes pt remains non weight bearing on right. Pt does not wish to try ambulating any distance other than to chair at this time if she cannot put weight on right, as it is too difficult. Pt participates in seated and long sit Bilateral lower extremities exercises; written handout provided and education on performance in seated, long sit and supine. Encouraged performance several times a day. Continue PT to progress strength and allow for improved stand activity tolerance for improved functional mobility.   Follow Up Recommendations  SNF     Equipment Recommendations  Rolling walker with 5" wheels    Recommendations for Other Services       Precautions / Restrictions Precautions Precautions: Fall Restrictions Weight Bearing Restrictions: Yes RLE Weight Bearing: Non weight bearing    Mobility  Bed Mobility               General bed mobility comments: Not tested; up in chair  Transfers                 General transfer comment: Pt transferred to chair with nursing who notes pt was able to hop a little with at most heel touch to floor; pt notes she does not wish to work on stand/amb due to not being able to put weight on RLE, but if can some weight on heel, pt would prefer. called nursing to clarify and pt continues NWB RLE currently  Ambulation/Gait             General Gait Details: Deferred   Stairs            Wheelchair Mobility     Modified Rankin (Stroke Patients Only)       Balance   Sitting-balance support: Feet supported Sitting balance-Leahy Scale: Good                              Cognition Arousal/Alertness: Awake/alert Behavior During Therapy: WFL for tasks assessed/performed Overall Cognitive Status: Within Functional Limits for tasks assessed                      Exercises General Exercises - Lower Extremity Quad Sets: Strengthening;Both;20 reps Gluteal Sets: Strengthening;Both;20 reps Long Arc Quad: Strengthening;Both;10 reps;Seated;AROM (2 sets each) Hip ABduction/ADduction: Strengthening;Both;10 reps;Seated (straight leg, 2 sets of 10 ea) Straight Leg Raises: Strengthening;Both;10 reps;Seated (2 srts each) Hip Flexion/Marching: AROM;Both;20 reps;Seated Toe Raises: Strengthening;Both;20 reps;Seated    General Comments        Pertinent Vitals/Pain Pain Assessment: No/denies pain    Home Living                      Prior Function            PT Goals (current goals can now be found in the care plan section) Progress towards PT goals: Progressing toward goals    Frequency    7X/week  PT Plan Current plan remains appropriate    Co-evaluation             End of Session   Activity Tolerance: Patient tolerated treatment well Patient left: in chair;with call bell/phone within reach;with chair alarm set     Time: 1130-1155 PT Time Calculation (min) (ACUTE ONLY): 25 min  Charges:  $Therapeutic Exercise: 23-37 mins                    G Codes[place] Dock:      Heidi E Barnes, PTA 08/24/2016

## 2016-08-24 NOTE — Progress Notes (Signed)
[place] [facility] [place] NAME: Brooke [name]    MR#:  [id number]  DATE OF BIRTH:  1950/10/30  SUBJECTIVE:   Patient is here due to right diabetic foot infection. Seen by podiatry and status post incision and drainage of the wound 08/20/2016. Postop day #3. Status post angiogram today showing no evidence of peripheral artery diseas Renal function stable. The patient feels good today, denies any pain. Redness, erythema, swelling has been subsiding daily. Chest x-ray yesterday revealed pulmonary vascular congestion without frank interstitial edema, atelectasis in the left base, patient was restarted on Lasix orally. She feels good today, denies any shortness of breath. Physical therapist saw patient in consultation and recommended skilled nursing facility placement. Patient is from [place], [place], she prefers to go to skilled nursing facility close to her home town.   REVIEW OF SYSTEMS:    Review of Systems  Constitutional: Positive for chills. Negative for fever and weight loss.  HENT: Negative for congestion, nosebleeds and tinnitus.   Eyes: Negative for blurred vision, double vision and redness.  Respiratory: Negative for cough, hemoptysis and shortness of breath.   Cardiovascular: Negative for chest pain, orthopnea, leg swelling and PND.  Gastrointestinal: Negative for abdominal pain, diarrhea, melena, nausea and vomiting.  Genitourinary: Negative for dysuria, hematuria and urgency.  Musculoskeletal: Positive for joint pain (right foot pain. ). Negative for falls.  Neurological: Negative for dizziness, tingling, sensory change, focal weakness, seizures, weakness and headaches.  Endo/Heme/Allergies: Negative for polydipsia. Does not bruise/bleed easily.  Psychiatric/Behavioral: Negative for depression and memory loss. The patient is not nervous/anxious.     Nutrition: Carb control Tolerating Diet: Yes Tolerating PT: Await Eval.    DRUG  ALLERGIES:  No Known Allergies  VITALS:  Blood pressure (!) 162/56, pulse 66, temperature 98.9 F (37.2 C), temperature source Oral, resp. rate 19, height 5' 9"  (1.753 m), weight 115.7 kg (255 lb), SpO2 93 %.  PHYSICAL EXAMINATION:   Physical Exam  GENERAL:  65 y.o.-year-old obese patient sitting up in bed in no acute distress.  EYES: Pupils equal, round, reactive to light and accommodation. No scleral icterus. Extraocular muscles intact.  HEENT: Head atraumatic, normocephalic. Oropharynx and nasopharynx clear.  NECK:  Supple, no jugular venous distention. No thyroid enlargement, no tenderness.  LUNGS: Better air entrance, breath sounds bilaterally, no wheezing, rales, rhonchi. No use of accessory muscles of respiration.  CARDIOVASCULAR: S1, S2 normal. No murmurs, rubs, or gallops.  ABDOMEN: Soft, nontender, nondistended. Bowel sounds present. No organomegaly or mass.  EXTREMITIES: No cyanosis, clubbing or edema b/l.  Right foot diabetic foot ulcer s/p I & D and wound VAC is on erythema is subsiding. No significant pain on palpation. Swelling has subsided as well  NEUROLOGIC: Cranial nerves II through XII are intact. No focal Motor or sensory deficits b/l.   PSYCHIATRIC: The patient is alert and oriented x 3.  Good affect.  SKIN: No obvious rash, lesion, or ulcer.    LABORATORY PANEL:   CBC  Recent Labs Lab 08/24/16 0418  WBC 6.3  HGB 8.0*  HCT 23.3*  PLT 314   ------------------------------------------------------------------------------------------------------------------  Chemistries   Recent Labs Lab 08/18/16 1857  08/24/16 0418  NA 133*  < > 139  K 3.8  < > 3.3*  CL 100*  < > 112*  CO2 20*  < > 20*  GLUCOSE 357*  < > 197*  BUN 46*  < > 44*  CREATININE 3.53*  < > 2.89*  CALCIUM 8.3*  < > 8.4*  AST 21  --   --   ALT 11*  --   --   ALKPHOS 66  --   --   BILITOT 0.3  --   --   < > = values in this interval not  displayed. ------------------------------------------------------------------------------------------------------------------  Cardiac Enzymes No results for input(s): TROPONINI in the last 168 hours. ------------------------------------------------------------------------------------------------------------------  RADIOLOGY:  Dg Chest Port 1 View  Result Date: 08/23/2016 CLINICAL DATA:  Shortness of breath EXAM: PORTABLE CHEST 1 VIEW COMPARISON:  None. FINDINGS: Pulmonary vascular congestion. No frank interstitial edema. Mild left basilar opacity, likely atelectasis. No pleural effusion or pneumothorax. Cardiomegaly.  Postsurgical changes related to prior CABG. Median sternotomy. IMPRESSION: Pulmonary vascular congestion, without frank interstitial edema. Mild left basilar opacity, likely atelectasis. Electronically Signed   By: Julian Hy M.D.   On: 08/23/2016 09:30     ASSESSMENT AND PLAN:   65 year old female with past medical history of diabetes, diabetic neuropathy, chronic kidney disease stage III, osteoarthritis, peripheral vascular disease, coronary artery disease, anxiety who presented to the hospital due to a right foot ulcer with foul-smelling drainage.  1. Sepsis-patient met criteria on admission given the tachycardia, leukocytosis and infected right diabetic foot ulcer. Blood cultures are negative so far - afebrile since admission.  Hemodynamically stable.  Wound culture revealed moderate viridans streptococcus, few Enterococcus faecalis sensitive to ampicillin, gentamicin and vancomycin . Change antibiotic to Augmentin prior to discharge to home, continue Unasyn for now until she is seen by a podiatrist.  2. Infected right foot diabetic ulcer, no osteomyelitis-cause of patient's sepsis. -Continue IV  Unasyn.Blood cultures are negative so far, wound cultures revealed Streptococcus viridans, enterococcus faecalis -Seen by podiatry and pt. Is s/p I & 08/20/2016 POD # 3.  Seen by Vascular and s/p angiogram 10/27/2017and no evidence of PAD.  Await further podiatry input regarding need for possible amputation (vs) treatment with antibiotics.  -MRI showed no evidence of osteomyelitis.   3. CKD Stage 4 - no baseline Cr. to compare with.  -Appreciate nephrology input. 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 No acute indication for hemodialysis presently. Renal function remains stable after  angiogram and with diuretics  4. Diabetes - appreciate Diabetes Coordinator input.  Advance insulin  70/30 even higher today and continue SSI coverage and monitor.   5. HTN - cont. Imdur.   6. DM Neuropathy - cont. Neurontin.   7. GERD - cont. Protonix.   8. Depression - cont. Prozac.    9. acute on chronic systolic CHF,  Continue home doses of Lasix orally   Dispo-pending further Podiatry, Renal input. Getting physical therapist evaluation to rule out need to skilled nursing facility for rehabilitation  All the records are reviewed and case discussed with Care Management/Social Worker. Management plans discussed with the patient, family and they are in agreement.  CODE STATUS: Full code  DVT Prophylaxis: Heparin SQ  TOTAL TIME TAKING CARE OF THIS PATIENT: 40 minutes.   discussed with Dr. Candiss Norse   POSSIBLE D/C IN 1-2 DAYS, DEPENDING ON CLINICAL CONDITION.   Theodoro Grist M.D on 08/24/2016 at 12:55 PM  Between 7am to 6pm - Pager - [phone]  After 6pm go to [email or web address] - [employer] [facility]  Office  [phone]  CC: Primary care physician; No primary care provider on file.

## 2016-08-24 NOTE — Progress Notes (Signed)
Inpatient Diabetes Program Recommendations  AACE/ADA: New Consensus Statement on Inpatient Glycemic Control (2015)  Target Ranges:  Prepandial:   less than 140 mg/dL      Peak postprandial:   less than 180 mg/dL (1-2 hours)      Critically ill patients:  140 - 180 mg/dL   Lab Results  Component Value Date   GLUCAP 290 (H) 08/24/2016   HGBA1C 6.2 (H) 08/19/2016    Review of Glycemic Control:  Results for Brooke [name], Brooke (MRN [id number]) as of 08/24/2016 13:18  Ref. Range 08/23/2016 11:14 08/23/2016 16:12 08/23/2016 21:16 08/24/2016 07:39 08/24/2016 11:02  Glucose-Capillary Latest Ref Range: 65 - 99 mg/dL 782265 (H) 956365 (H) 213226 (H) 220 (H) 290 (H)    Diabetes history: Type 2 diabetes Outpatient Diabetes medications: Humulin 70/30 100 units with breakfast and 75 units with supper.  Humulin R- 30 units bid.  Current orders for Inpatient glycemic control: Novolog 70/30 30 units bid, Novolog sensitive tid with meals and HS  Inpatient Diabetes Program Recommendations:    Please consider further increase of Novolog 70/30 to 40 units bid.   Thanks, Brooke MeagerJenny Zooey Schreurs, RN, BC-ADM Inpatient Diabetes Coordinator Pager [phone] (8a-5p)

## 2016-08-24 NOTE — Progress Notes (Signed)
Shift assessment completed. Pt awake, alert and oriented fully. Pt is on room air, lungs are clear bilat, S1S2 heard. Abdomen is soft, bs heard. Pt denied difficulty voiding, R foot has wound vac intact laterally to toe area, scant serous drainage noted, pt denied pain, ppp, pt has trace to +1 pitting edema to R lower leg. l ppp, no edema noted. PIV intact to bilat arms, #20, both sites are free of redness and swelling. Pt oob to recliner with one assisst after eating, pt in no distress, no complaints.

## 2016-08-24 NOTE — Consult Note (Signed)
CH visited with patients from referral from prayer request box. Patients was in good spirits and was eager to chat. Would like follow-up visit for continued support. CH ended visit with prayer.

## 2016-08-24 NOTE — Progress Notes (Signed)
[facility]  ROUNDING NOTE   Subjective:  PatientStates that she feels improved. Creatinine fluctuating between 2.7-2.9 which may be her new baseline. Wound VAC was changed in the right this admission She has some lower extremity edema Able to eat without nausea or vomiting Denies shortness of breath   Objective:  Vital signs in last 24 hours:  Temp:  [98.2 F (36.8 C)-98.9 F (37.2 C)] 98.9 F (37.2 C) (10/30 0741) Pulse Rate:  [60-69] 61 (10/30 0741) Resp:  [18-19] 19 (10/30 0741) BP: (129-162)/(50-66) 162/56 (10/30 0741) SpO2:  [94 %-100 %] 94 % (10/30 0741)  Weight change:  Filed Weights   08/18/16 1832 08/18/16 2310 08/21/16 0814  Weight: 108.9 kg (240 lb) 115.7 kg (255 lb) 115.7 kg (255 lb)    Intake/Output: I/O last 3 completed shifts: In: 1020 [P.O.:720; IV Piggyback:300] Out: 425 [Urine:400; Drains:25]   Intake/Output this shift:  Total I/O In: 360 [P.O.:360] Out: -   Physical Exam: General: NAD, Sitting up in chair   Head: Normocephalic, atraumatic. Moist oral mucosal membranes  Eyes: Anicteric  Neck: Supple, trachea midline  Lungs:  Mild basilar crackles   Heart: Regular rate and rhythm  Abdomen:  Soft, nontender, bowel sounds present  Extremities: Right foot wound vac, 2+ bilateral lower extremity edema   Neurologic: Nonfocal, moving all four extremities  Skin: No lesions  Access: none    Basic Metabolic Panel:  Recent Labs Lab 08/19/16 0521 08/20/16 0455 08/21/16 0326 08/22/16 1610 08/22/16 2355 08/23/16 0258 08/24/16 0418  NA 137 138  --  140 139  --  139  K 3.7 3.4*  --  3.2* 3.2*  --  3.3*  CL 108 111  --  112* 111  --  112*  CO2 20* 18*  --  18* 19*  --  20*  GLUCOSE 200* 201*  --  240* 233*  --  197*  BUN 45* 49*  --  42* 43*  --  44*  CREATININE 3.37* 3.46*  --  2.87* 2.88* 2.77* 2.89*  CALCIUM 7.7* 8.1*  --  8.6* 8.4*  --  8.4*  PHOS  --   --  3.9 3.9  --   --   --     Liver Function Tests:  Recent  Labs Lab 08/18/16 1857 08/22/16 0608  AST 21  --   ALT 11*  --   ALKPHOS 66  --   BILITOT 0.3  --   PROT 7.1  --   ALBUMIN 2.6* 2.2*   No results for input(s): LIPASE, AMYLASE in the last 168 hours. No results for input(s): AMMONIA in the last 168 hours.  CBC:  Recent Labs Lab 08/18/16 1857 08/19/16 0521 08/20/16 0455 08/21/16 0326 08/24/16 0418  WBC 17.0* 9.7 9.4 7.4 6.3  NEUTROABS 15.1*  --   --   --   --   HGB 9.1* 8.7* 8.6* 8.6* 8.0*  HCT 28.1* 25.7* 25.5* 25.4* 23.3*  MCV 85.8 85.6 84.4 84.7 84.0  PLT 285 199 258 260 314    Cardiac Enzymes: No results for input(s): CKTOTAL, CKMB, CKMBINDEX, TROPONINI in the last 168 hours.  BNP: Invalid input(s): POCBNP  CBG:  Recent Labs Lab 08/23/16 1114 08/23/16 1612 08/23/16 2116 08/24/16 0739 08/24/16 1102  GLUCAP 265* 365* 226* 220* 290*    Microbiology: Results for orders placed or performed during the hospital encounter of 08/18/16  Blood Culture (routine x 2)     Status: None   Collection Time: 08/18/16  6:58 PM  Result Value Ref Range Status   Specimen Description BLOOD LEFT ARM  Final   Special Requests   Final    BOTTLES DRAWN AEROBIC AND ANAEROBIC  AER10CC ANA 10CC   Culture NO GROWTH 5 DAYS  Final   Report Status 08/23/2016 FINAL  Final  Blood Culture (routine x 2)     Status: None   Collection Time: 08/18/16  6:58 PM  Result Value Ref Range Status   Specimen Description BLOOD  RIGHT ARM  Final   Special Requests   Final    BOTTLES DRAWN AEROBIC AND ANAEROBIC  AER 1CC ANA 1CC   Culture NO GROWTH 5 DAYS  Final   Report Status 08/23/2016 FINAL  Final  Surgical PCR screen     Status: None   Collection Time: 08/18/16 11:15 PM  Result Value Ref Range Status   MRSA, PCR NEGATIVE NEGATIVE Final   Staphylococcus aureus NEGATIVE NEGATIVE Final    Comment:        The Xpert SA Assay (FDA approved for NASAL specimens in patients over 65 years of age), is one component of a comprehensive  surveillance program.  Test performance has been validated by [facility] for patients greater than or equal to 65 year old. It is not intended to diagnose infection nor to guide or monitor treatment.   Aerobic/Anaerobic Culture (surgical/deep wound)     Status: None   Collection Time: 08/19/16  2:09 PM  Result Value Ref Range Status   Specimen Description WOUND  Final   Special Requests Normal  Final   Gram Stain   Final    RARE WBC PRESENT, PREDOMINANTLY PMN ABUNDANT GRAM POSITIVE COCCI IN PAIRS FEW GRAM NEGATIVE RODS FEW GRAM POSITIVE COCCI IN CLUSTERS    Culture   Final    MODERATE VIRIDANS STREPTOCOCCUS FEW ENTEROCOCCUS FAECALIS NO ANAEROBES ISOLATED [facility]formed [facility]    Report Status 08/24/2016 FINAL  Final   Organism ID, Bacteria ENTEROCOCCUS FAECALIS  Final      Susceptibility   Enterococcus faecalis - MIC*    AMPICILLIN <=2 SENSITIVE Sensitive     VANCOMYCIN 1 SENSITIVE Sensitive     GENTAMICIN SYNERGY SENSITIVE Sensitive     * FEW ENTEROCOCCUS FAECALIS    Coagulation Studies: No results for input(s): LABPROT, INR in the last 72 hours.  Urinalysis:  Recent Labs  08/22/16 0342  COLORURINE STRAW*  LABSPEC 1.009  PHURINE 5.0  GLUCOSEU >500*  HGBUR 2+*  BILIRUBINUR NEGATIVE  KETONESUR NEGATIVE  PROTEINUR >500*  NITRITE NEGATIVE  LEUKOCYTESUR NEGATIVE      Imaging: Dg Chest Port 1 View  Result Date: 08/23/2016 CLINICAL DATA:  Shortness of breath EXAM: PORTABLE CHEST 1 VIEW COMPARISON:  None. FINDINGS: Pulmonary vascular congestion. No frank interstitial edema. Mild left basilar opacity, likely atelectasis. No pleural effusion or pneumothorax. Cardiomegaly.  Postsurgical changes related to prior CABG. Median sternotomy. IMPRESSION: Pulmonary vascular congestion, without frank interstitial edema. Mild left basilar opacity, likely atelectasis. Electronically Signed   By: Charline BillsSriyesh  Krishnan M.D.   On: 08/23/2016 09:30     Medications:      . amLODipine  5 mg Oral Daily  . ampicillin-sulbactam (UNASYN) IV  3 g Intravenous Q12H  . atorvastatin  10 mg Oral Daily  . calcitRIOL  0.25 mcg Oral Daily  . FLUoxetine  40 mg Oral Daily  . furosemide  80 mg Oral BID  . gabapentin  800 mg Oral BID  . heparin  5,000 Units  Subcutaneous Q8H  . insulin aspart  0-5 Units Subcutaneous QHS  . insulin aspart  0-9 Units Subcutaneous TID WC  . insulin aspart protamine- aspart  30 Units Subcutaneous BID WC  . isosorbide mononitrate  120 mg Oral Daily  . LORazepam  1 mg Oral BID  . metoprolol tartrate  100 mg Oral BID  . pantoprazole  40 mg Oral QAC breakfast   acetaminophen **OR** acetaminophen, ipratropium-albuterol, ondansetron **OR** ondansetron (ZOFRAN) IV, oxyCODONE  Assessment/ Plan:  Brooke [name] is a 65 y.o. white female with hypertension, hyperlipidemia, diabetes mellitus type II Insulin dependent, coronary artery disease status post CABG, anxiety/depression, cholecystectomy  who was [facility]18[facility] for Wound infection [T14.8XXA, L08.9] Cellulitis of right lower extremity [L03.115] Sepsis, due to unspecified organism (HCC) [A41.9] Osteomyelitis of right foot, unspecified type (HCC) [M86.9]   1. Acute renal failure/chronic kidney disease stage IV: history suggestive that this is chronic kidney disease secondary to diabetic nephropathy and hypertension.   Patient states baseline Cr in the 2.5 range -  Renal function appears to be stabilizing at creatinine 2.7-2.9  2. Diabetes mellitus type II with CKD: insulin dependent: hemoglobin A1c 6.2%  - low most likely low due to renal failure  3. Anemia of chronic kidney disease: hemoglobin low at 8.0. Iron deficiency.  - Avoid iron for now. Consider Epogen if hemoglobin continues to drop.  4. Secondary Hyperparathyroidism: with hypocalcemia. Phosphorus at goal.  - PTH high at 230. Continue calcitriol 0.25 g by mouth daily.  5. Peripheral vascular disease with  diabetic foot ulcer: status post angiogram Dr. Wyn Quakerew on 10/27 and debridement on 10/26 Dr. Orland Jarredroxler - IV antibiotics as per internal medicine team  6. Lower extremity edema Sodium restricted diet Avoid drinking too much fluid. Follow thirst Currently getting Lasix 80 mg twice a day Avoid hypotension    LOS: 6 Swannie Milius 10/30/201711:24 AM

## 2016-08-24 NOTE — Progress Notes (Signed)
Chaplain was making a [facility] But [facility], and so CH just left.   08/24/16 1000  Clinical Encounter Type  Visited With Patient  Visit Type Initial;Spiritual support  Referral From Chaplain  Consult/Referral To Chaplain  Spiritual Encounters  Spiritual Needs Prayer

## 2016-08-24 NOTE — Progress Notes (Signed)
LCSW has met with patient regarding bed offers. She has not made decision regarding bed of choice as we are pending outcome of wound vac which could change bed status if facility cannot manage. LCSW will follow up for disposition and bed choice.  Lane Hacker, MSW Clinical Social Work: [employer] Coverage for :  [phone]

## 2016-08-25 DIAGNOSIS — L03115 Cellulitis of right lower limb: Secondary | ICD-10-CM

## 2016-08-25 DIAGNOSIS — L97509 Non-pressure chronic ulcer of other part of unspecified foot with unspecified severity: Secondary | ICD-10-CM

## 2016-08-25 LAB — GLUCOSE, CAPILLARY
GLUCOSE-CAPILLARY: 182 mg/dL — AB (ref 65–99)
Glucose-Capillary: 239 mg/dL — ABNORMAL HIGH (ref 65–99)

## 2016-08-25 LAB — BASIC METABOLIC PANEL
Anion gap: 12 (ref 5–15)
BUN: 43 mg/dL — AB (ref 6–20)
CALCIUM: 8.6 mg/dL — AB (ref 8.9–10.3)
CO2: 18 mmol/L — ABNORMAL LOW (ref 22–32)
Chloride: 109 mmol/L (ref 101–111)
Creatinine, Ser: 2.63 mg/dL — ABNORMAL HIGH (ref 0.44–1.00)
GFR calc Af Amer: 21 mL/min — ABNORMAL LOW (ref >60)
GFR, EST NON AFRICAN AMERICAN: 18 mL/min — AB (ref >60)
GLUCOSE: 257 mg/dL — AB (ref 65–99)
POTASSIUM: 3.3 mmol/L — AB (ref 3.5–5.1)
Sodium: 139 mmol/L (ref 135–145)

## 2016-08-25 MED ORDER — INSULIN ASPART PROT & ASPART (70-30 MIX) 100 UNIT/ML ~~LOC~~ SUSP: 35.0000 [IU] | Freq: Two times a day (BID) | SUBCUTANEOUS | 11 refills | Status: AC

## 2016-08-25 MED ORDER — LORAZEPAM 1 MG PO TABS: 1.0000 mg | ORAL_TABLET | Freq: Two times a day (BID) | ORAL | 0 refills | Status: AC

## 2016-08-25 MED ORDER — CALCITRIOL 0.25 MCG PO CAPS: 0.2500 ug | ORAL_CAPSULE | Freq: Every day | ORAL | 6 refills | Status: AC

## 2016-08-25 MED ORDER — OXYCODONE HCL 5 MG PO TABS: 5.0000 mg | ORAL_TABLET | ORAL | 0 refills | Status: AC | PRN

## 2016-08-25 MED ORDER — POTASSIUM CHLORIDE CRYS ER 20 MEQ PO TBCR
40.0000 meq | EXTENDED_RELEASE_TABLET | Freq: Once | ORAL | Status: AC
  Administered 2016-08-25: 40 meq via ORAL
  Filled 2016-08-25: qty 2

## 2016-08-25 MED ORDER — METOPROLOL TARTRATE 100 MG PO TABS: 100.0000 mg | ORAL_TABLET | Freq: Two times a day (BID) | ORAL | 6 refills | Status: AC

## 2016-08-25 MED ORDER — AMOXICILLIN-POT CLAVULANATE 500-125 MG PO TABS: 1.0000 | ORAL_TABLET | Freq: Three times a day (TID) | ORAL | 0 refills | Status: AC

## 2016-08-25 MED ORDER — FUROSEMIDE 10 MG/ML IJ SOLN
40.0000 mg | Freq: Once | INTRAMUSCULAR | Status: AC
  Administered 2016-08-25: 40 mg via INTRAVENOUS
  Filled 2016-08-25: qty 4

## 2016-08-25 NOTE — Clinical Social Work Placement (Signed)
   CLINICAL SOCIAL WORK PLACEMENT  NOTE  Date:  08/25/2016  Patient Details  Name: Brooke [name] MRN: [id number] Date of Birth: 02/14/1951  Clinical Social Work is seeking post-discharge placement for this patient at the Skilled  Nursing Facility level of care (*CSW will initial, date and re-position this form in  chart as items are completed):  Yes   Patient/family provided with [place] Clinical Social Work Department's list of facilities offering this level of care within the geographic area requested by the patient (or if unable, by the patient's family).  Yes   Patient/family informed of their freedom to choose among providers that offer the needed level of care, that participate in Medicare, Medicaid or managed care program needed by the patient, have an available bed and are willing to accept the patient.  Yes   Patient/family informed of [facility]'s ownership interest in [facility] and [facility], as well as of the fact that they are under no obligation to receive care at these facilities.  PASRR submitted to EDS on       PASRR number received on       Existing PASRR number confirmed on 08/23/16     FL2 transmitted to all facilities in geographic area requested by pt/family on 08/23/16     FL2 transmitted to all facilities within larger geographic area on       Patient informed that his/her managed care company has contracts with or will negotiate with certain facilities, including the following:        Yes   Patient/family informed of bed offers received.  Patient chooses bed at  ([facility] )     Physician recommends and patient chooses bed at      Patient to be transferred to  ([facility] ) on 08/25/16.  Patient to be transferred to facility by  [facility] )     Patient family notified on 08/25/16 of transfer.  Name of family member notified:   (CSW left patient's friend Lowell BoutonKierra Allen a voicemail. )     PHYSICIAN       Additional Comment:     _______________________________________________ Donovin Kraemer, Darleen CrockerBailey M, LCSW 08/25/2016, 1:43 PM

## 2016-08-25 NOTE — Progress Notes (Signed)
Pt being discharged today to [facility]. PIV removed. Wound vac taken down and WTD gauze dressing applied. Discharge information reviewed with pt, all questions answered. [facility] all questions answered. Prescriptions sent with pt in discharge packet. She is leaving with all her belongings, will be transported via EMS. Patient and family are aware and in agreement with plan.

## 2016-08-25 NOTE — Discharge Summary (Signed)
[name] NAME: Brooke [name]    MR#:  [id number]  DATE OF BIRTH:  1951-01-20  DATE OF ADMISSION:  08/18/2016 ADMITTING PHYSICIAN: Lance Coon, MD  DATE OF DISCHARGE: No discharge date for patient encounter.  PRIMARY CARE PHYSICIAN: No primary care provider on file.     ADMISSION DIAGNOSIS:  Wound infection [T14.8XXA, L08.9] Cellulitis of right lower extremity [L03.115] Sepsis, due to unspecified organism ([place]) [A41.9] Osteomyelitis of right foot, unspecified type ([place]) [M86.9]  DISCHARGE DIAGNOSIS:  Principal Problem:   Sepsis ([place]) Active Problems:   Foot ulcer ([place])   Cellulitis of foot, right   Diabetes mellitus with foot ulcer ([place])   HTN (hypertension)   CAD (coronary artery disease)   CKD (chronic kidney disease) stage 4, GFR 15-29 ml/min (H[facility])   Anxiety   GERD (gastroesophageal reflux disease)   HLD (hyperlipidemia)   SECONDARY DIAGNOSIS:   Past Medical History:  Diagnosis Date  . Anxiety   . Coronary artery disease   . Diabetes mellitus without complication ([place])   . GERD (gastroesophageal reflux disease)   . HLD (hyperlipidemia)   . Hypertension     .pro HOSPITAL COURSE:   The patient is 65 year old Caucasian female with past medical history significant for history of coronary artery disease, diabetes mellitus, hypertension, hyperlipidemia, gastroesophageal reflux disease who presents to the hospital with complaints of 4-5 day history of worsening right foot ulcer, bleeding. Apparently she had surgery on the right foot. 3 months ago. On arrival to the hospital, she was noted to be feverish, had leukocytosis and was admitted. She was initiated on broad-spectrum antibiotic therapy and was seen by podiatrist, Dr. Elvina Mattes. Dr. Elvina Mattes felt that patient's wound is diabetic and has severe necrosis and significant soft tissue loss. He was able to open wound at the bedside and drained a significant  amount of pus. He packed the area open and opened up, but some ulceration which was also necrotic. Patient underwent debridement of necrotic infected skin and soft tissue septations tissue as well as capsular tissue and tendon on 08/20/2016 by Dr. Elvina Mattes. Wound VAC was placed. Patient was seen by vascular surgery and underwent selective right lower extremity angiogram to assess perfusion, normal aorta and iliac arteries without significant stenosis was noted. No left renal artery stenosis was identified, right renal artery was not well seen, normal common femoral artery, profunda femoris artery, superficial femoral artery, popliteal artery, normal tibial trifurcation, with 2 vessel run off distally with the peroneal artery being dominant, the posterior tibial artery being small, but both being continued as distended without focal stenosis identified. 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 Patient was evaluated by physical therapist and recommended skilled nursing facility placement. She decided on local skilled nursing facility where she will be discharged to today. Wound VAC will be cared by a skilled nursing facility per protocol and patient will be followed by Dr. Elvina Mattes weekly. Discussion by problem: 1. Sepsis-patient met criteria on admission given the tachycardia, leukocytosis and infected right diabetic foot ulcer. Blood cultures were negative. Wound culture revealed moderate viridans streptococcus, few Enterococcus faecalis sensitive to ampicillin, gentamicin and vancomycin . The patient continued on Unasyn while in the hospital, now with a changing antibiotic to Augmentin, the patient will continue wound VAC, to be cared by skilled nursing facility per protocol, follow up with Dr. Elvina Mattes in about 1 week after discharge.  2. Infected right foot diabetic  ulcer, no osteomyelitis-cause of patient's sepsis. The patient was continued on IV  Zosyn and vancomycin initially, which was changed to IV  Unasyn when the wound cultures became known..Blood cultures are negative, wound cultures revealed Streptococcus viridans, enterococcus faecalis - s/p I & 08/20/2016 POD # 4. Seen by Vascular and s/p angiogram 10/27/2017and no evidence of PAD. MRI showed no evidence of osteomyelitis. Wound VAC to be continued, follow-up with Dr. Elvina Mattes in about 1 week after discharge.  3. CKD Stage 4 - no baseline Cr. to compare with.  -Appreciate nephrology input. 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 No acute indication for hemodialysis presently. Renal function remains stable after  angiogram and with chronic doses of diuretics, it is recommended to follow kidney function as outpatient, patient is to follow-up with primary nephrologist as outpatient.   4. Diabetes - appreciate Diabetes Coordinator input.  Advance insulin  70/30 to 35 units twice a day, advance it as needed depending on blood glucose levels and continue SSI coverage and monitor. Hemoglobin A1c was 6.2.   5. HTN - cont. Imdur, Norvasc, Lopressor, Cozaar.   6. DM Neuropathy - cont. Neurontin.   7. GERD - cont. Protonix.   8. Depression - cont. Prozac.    9. acute on chronic systolic CHF,  Continue home doses of Lasix orally, Cozaar   DISCHARGE CONDITIONS:   Stable  CONSULTS OBTAINED:  Treatment Team:  Albertine Patricia, DPM  DRUG ALLERGIES:  No Known Allergies  DISCHARGE MEDICATIONS:   Current Discharge Medication List    START taking these medications   Details  amoxicillin-clavulanate (AUGMENTIN) 500-125 MG tablet Take 1 tablet (500 mg total) by mouth 3 (three) times daily. Qty: 42 tablet, Refills: 0    calcitRIOL (ROCALTROL) 0.25 MCG capsule Take 1 capsule (0.25 mcg total) by mouth daily. Qty: 30 capsule, Refills: 6    insulin aspart protamine- aspart (NOVOLOG MIX 70/30) (70-30) 100 UNIT/ML injection Inject 0.35  mLs (35 Units total) into the skin 2 (two) times daily with a meal. Qty: 10 mL, Refills: 11    oxyCODONE (OXY IR/ROXICODONE) 5 MG immediate release tablet Take 1 tablet (5 mg total) by mouth every 4 (four) hours as needed for moderate pain or severe pain. Qty: 30 tablet, Refills: 0      CONTINUE these medications which have CHANGED   Details  LORazepam (ATIVAN) 1 MG tablet Take 1 tablet (1 mg total) by mouth 2 (two) times daily. Qty: 30 tablet, Refills: 0    metoprolol (LOPRESSOR) 100 MG tablet Take 1 tablet (100 mg total) by mouth 2 (two) times daily. Qty: 60 tablet, Refills: 6      CONTINUE these medications which have NOT CHANGED   Details  amLODipine (NORVASC) 5 MG tablet Take 5 mg by mouth every evening. Refills: 3    atorvastatin (LIPITOR) 10 MG tablet Take 10 mg by mouth daily. Refills: 3    famotidine (PEPCID) 20 MG tablet Take 1 tablet by mouth daily.    FLUoxetine (PROZAC) 40 MG capsule Take 1 capsule by mouth daily.    furosemide (LASIX) 80 MG tablet Take 80 mg by mouth 2 (two) times daily.  Refills: 3    gabapentin (NEURONTIN) 800 MG tablet Take 1 tablet by mouth 2 (two) times daily.    gemfibrozil (LOPID) 600 MG tablet Take 600 mg by mouth 2 (two) times daily. Refills: 3    isosorbide mononitrate (IMDUR) 120 MG 24 hr tablet Take 120 mg by mouth  daily. Refills: 2    losartan (COZAAR) 100 MG tablet Take 1 tablet by mouth daily.    omega-3 acid ethyl esters (LOVAZA) 1 g capsule Take 2 capsules by mouth 2 (two) times daily.    omeprazole (PRILOSEC) 20 MG capsule Take 1 capsule by mouth daily.    traMADol (ULTRAM) 50 MG tablet Take 50 mg by mouth every 6 (six) hours as needed.    VICTOZA 18 MG/3ML SOPN Inject 1.8 mg into the skin daily. morning      STOP taking these medications     HUMULIN 70/30 (70-30) 100 UNIT/ML injection      HUMULIN R 100 UNIT/ML injection          DISCHARGE INSTRUCTIONS:    The patient is to follow-up with podiatry,  primary care physician whenever she is back home, nephrologist  If you experience worsening of your admission symptoms, develop shortness of breath, life threatening emergency, suicidal or homicidal thoughts you must seek medical attention immediately by calling 911 or calling your MD immediately  if symptoms less severe.  You Must read complete instructions/literature along with all the possible adverse reactions/side effects for all the Medicines you take and that have been prescribed to you. Take any new Medicines after you have completely understood and accept all the possible adverse reactions/side effects.   Please note  You were cared for by a hospitalist during your hospital stay. If you have any questions about your discharge medications or the care you received while you were in the hospital after you are discharged, you can call the unit and asked to speak with the hospitalist on call if the hospitalist that took care of you is not available. Once you are discharged, your primary care physician will handle any further medical issues. Please note that NO REFILLS for any discharge medications will be authorized once you are discharged, as it is imperative that you return to your primary care physician (or establish a relationship with a primary care physician if you do not have one) for your aftercare needs so that they can reassess your need for medications and monitor your lab values.    Today   CHIEF COMPLAINT:   Chief Complaint  Patient presents with  . Wound Infection    HISTORY OF PRESENT ILLNESS:  Brooke [name]  is a 65 y.o. female with a known history of coronary artery disease, diabetes mellitus, hypertension, hyperlipidemia, gastroesophageal reflux disease who presents to the hospital with complaints of 4-5 day history of worsening right foot ulcer, bleeding. Apparently she had surgery on the right foot. 3 months ago. On arrival to the hospital, she was noted to be feverish,  had leukocytosis and was admitted. She was initiated on broad-spectrum antibiotic therapy and was seen by podiatrist, Dr. Elvina Mattes. Dr. Elvina Mattes felt that patient's wound is diabetic and has severe necrosis and significant soft tissue loss. He was able to open wound at the bedside and drained a significant amount of pus. He packed the area open and opened up, but some ulceration which was also necrotic. Patient underwent debridement of necrotic infected skin and soft tissue septations tissue as well as capsular tissue and tendon on 08/20/2016 by Dr. Elvina Mattes. Wound VAC was placed. Patient was seen by vascular surgery and underwent selective right lower extremity angiogram to assess perfusion, normal aorta and iliac arteries without significant stenosis was noted. No left renal artery stenosis was identified, right renal artery was not well seen, normal common femoral artery, profunda femoris  artery, superficial femoral artery, popliteal artery, normal tibial trifurcation, with 2 vessel run off distally with the peroneal artery being dominant, the posterior tibial artery being small, but both being continued as distended without focal stenosis identified. 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 Patient was evaluated by physical therapist and recommended skilled nursing facility placement. She decided on local skilled nursing facility where she will be discharged to today. Wound VAC will be cared by a skilled nursing facility per protocol and patient will be followed by Dr. Elvina Mattes weekly. Discussion by problem: 1. Sepsis-patient met criteria on admission given the tachycardia, leukocytosis and infected right diabetic foot ulcer. Blood cultures were negative. Wound culture revealed moderate viridans streptococcus, few Enterococcus faecalis sensitive to ampicillin, gentamicin and vancomycin . The patient continued on Unasyn while in the hospital, now with  a changing antibiotic to Augmentin, the patient will continue wound VAC, to be cared by skilled nursing facility per protocol, follow up with Dr. Elvina Mattes in about 1 week after discharge.  2. Infected right foot diabetic ulcer, no osteomyelitis-cause of patient's sepsis. The patient was continued on IV Zosyn and vancomycin initially, which was changed to IV  Unasyn when the wound cultures became known..Blood cultures are negative, wound cultures revealed Streptococcus viridans, enterococcus faecalis - s/p I & 08/20/2016 POD # 4. Seen by Vascular and s/p angiogram 10/27/2017and no evidence of PAD. MRI showed no evidence of osteomyelitis. Wound VAC to be continued, follow-up with Dr. Elvina Mattes in about 1 week after discharge.  3. CKD Stage 4 - no baseline Cr. to compare with.  -Appreciate nephrology input. 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 No acute indication for hemodialysis presently. Renal function remains stable after  angiogram and with chronic doses of diuretics, it is recommended to follow kidney function as outpatient, patient is to follow-up with primary nephrologist as outpatient.   4. Diabetes - appreciate Diabetes Coordinator input.  Advance insulin  70/30 to 35 units twice a day, advance it as needed depending on blood glucose levels and continue SSI coverage and monitor. Hemoglobin A1c was 6.2.   5. HTN - cont. Imdur, Norvasc, Lopressor, Cozaar.   6. DM Neuropathy - cont. Neurontin.   7. GERD - cont. Protonix.   8. Depression - cont. Prozac.    9. acute on chronic systolic CHF,  Continue home doses of Lasix orally, Cozaar    VITAL SIGNS:  Blood pressure (!) 163/87, pulse 68, temperature 98.2 F (36.8 C), temperature source Oral, resp. rate 18, height _0  (1.753 m), weight 115.7 kg (255 lb), SpO2 99 %.  I/O:    Intake/Output Summary (Last 24 hours) at 08/25/16 0929 Last data filed at 08/25/16 0026  Gross per 24 hour   Intake              480 ml  Output             1800 ml  Net            -1320 ml    PHYSICAL EXAMINATION:  GENERAL:  65 y.o.-year-old patient lying in the bed with no acute distress.  EYES: Pupils equal, round, reactive to light and accommodation. No scleral icterus. Extraocular muscles intact.  HEENT: Head atraumatic, normocephalic. Oropharynx and nasopharynx clear.  NECK:  Supple, no jugular venous distention. No thyroid enlargement, no tenderness.  LUNGS: Normal breath sounds bilaterally, no wheezing, rales,rhonchi , Few scattered crepitations noted anteriorly on the  left. No use of accessory muscles of respiration.  CARDIOVASCULAR: S1, S2 normal. No murmurs, rubs, or gallops.  ABDOMEN: Soft, non-tender, non-distended. Bowel sounds present. No organomegaly or mass.  EXTREMITIES: No pedal edema, cyanosis, or clubbing. Right foot has wound VAC, minimal surrounding erythema around the wound in lateral aspect of the foot, 1-2+ lower extremity swelling on the right, trace to 1+ lower extremity swelling on the left 40 NEUROLOGIC: Cranial nerves II through XII are intact. Muscle strength 5/5 in all extremities. Sensation intact. Gait not checked.  PSYCHIATRIC: The patient is alert and oriented x 3.  SKIN: No obvious rash, lesion, or ulcer.   DATA REVIEW:   CBC  Recent Labs Lab 08/24/16 0418  WBC 6.3  HGB 8.0*  HCT 23.3*  PLT 314    Chemistries   Recent Labs Lab 08/18/16 1857  08/24/16 0418  NA 133*  < > 139  K 3.8  < > 3.3*  CL 100*  < > 112*  CO2 20*  < > 20*  GLUCOSE 357*  < > 197*  BUN 46*  < > 44*  CREATININE 3.53*  < > 2.89*  CALCIUM 8.3*  < > 8.4*  AST 21  --   --   ALT 11*  --   --   ALKPHOS 66  --   --   BILITOT 0.3  --   --   < > = values in this interval not displayed.  Cardiac Enzymes No results for input(s): TROPONINI in the last 168 hours.  Microbiology Results  Results for orders placed or performed during the hospital encounter of 08/18/16  Blood  Culture (routine x 2)     Status: None   Collection Time: 08/18/16  6:58 PM  Result Value Ref Range Status   Specimen Description BLOOD LEFT ARM  Final   Special Requests   Final    BOTTLES DRAWN AEROBIC AND ANAEROBIC  AER10CC ANA 10CC   Culture NO GROWTH 5 DAYS  Final   Report Status 08/23/2016 FINAL  Final  Blood Culture (routine x 2)     Status: None   Collection Time: 08/18/16  6:58 PM  Result Value Ref Range Status   Specimen Description BLOOD  RIGHT ARM  Final   Special Requests   Final    BOTTLES DRAWN AEROBIC AND ANAEROBIC  AER 1CC ANA 1CC   Culture NO GROWTH 5 DAYS  Final   Report Status 08/23/2016 FINAL  Final  Surgical PCR screen     Status: None   Collection Time: 08/18/16 11:15 PM  Result Value Ref Range Status   MRSA, PCR NEGATIVE NEGATIVE Final   Staphylococcus aureus NEGATIVE NEGATIVE Final    Comment:        The Xpert SA Assay (FDA approved for NASAL specimens in patients over 19 years of age), is one component of a comprehensive surveillance program.  Test performance has been validated by [facility] for patients greater than or equal to 48 year old. It is not intended to diagnose infection nor to guide or monitor treatment.   Aerobic/Anaerobic Culture (surgical/deep wound)     Status: None   Collection Time: 08/19/16  2:09 PM  Result Value Ref Range Status   Specimen Description WOUND  Final   Special Requests Normal  Final   Gram Stain   Final    RARE WBC PRESENT, PREDOMINANTLY PMN ABUNDANT GRAM POSITIVE COCCI IN PAIRS FEW GRAM NEGATIVE RODS FEW GRAM POSITIVE COCCI IN CLUSTERS  Culture   Final    MODERATE VIRIDANS STREPTOCOCCUS FEW ENTEROCOCCUS FAECALIS NO ANAEROBES ISOLATED Performed at [facility]    Report Status 08/24/2016 FINAL  Final   Organism ID, Bacteria ENTEROCOCCUS FAECALIS  Final      Susceptibility   Enterococcus faecalis - MIC*    AMPICILLIN <=2 SENSITIVE Sensitive     VANCOMYCIN 1 SENSITIVE Sensitive     GENTAMICIN  SYNERGY SENSITIVE Sensitive     * FEW ENTEROCOCCUS FAECALIS    RADIOLOGY:  No results found.  EKG:   Orders placed or performed during the hospital encounter of 08/18/16  . ED EKG 12-Lead  . ED EKG 12-Lead  . EKG 12-Lead  . EKG 12-Lead      Management plans discussed with the patient, family and they are in agreement.  CODE STATUS:     Code Status Orders        Start     Ordered   08/18/16 2300  Full code  Continuous     08/18/16 2259    Code Status History    Date Active Date Inactive Code Status Order ID Comments User Context   This patient has a current code status but no historical code status.      TOTAL TIME TAKING CARE OF THIS PATIENT: 40 minutes.    Theodoro Grist M.D on 08/25/2016 at 9:29 AM  Between 7am to 6pm - Pager - [phone]  After 6pm go to [email or web address] - password [email or web address] [facility]  [facility]  Office  [phone]  CC: Primary care physician; No primary care provider on file.

## 2016-08-25 NOTE — Progress Notes (Signed)
[facility]  ROUNDING NOTE   Subjective:  Patient States that she feels improved. Creatinine fluctuating between 2.7-2.9 which may be her new baseline. Today's labs are pending Wound VAC was changed  this admission She has some lower extremity edema Able to eat without nausea or vomiting Denies shortness of breath   Objective:  Vital signs in last 24 hours:  Temp:  [97.9 F (36.6 C)-98.7 F (37.1 C)] 98.2 F (36.8 C) (10/31 0806) Pulse Rate:  [59-70] 68 (10/31 0806) Resp:  [17-20] 18 (10/31 0806) BP: (142-170)/(61-87) 163/87 (10/31 0806) SpO2:  [97 %-99 %] 99 % (10/31 0806)  Weight change:  Filed Weights   08/18/16 1832 08/18/16 2310 08/21/16 0814  Weight: 108.9 kg (240 lb) 115.7 kg (255 lb) 115.7 kg (255 lb)    Intake/Output: I/O last 3 completed shifts: In: 940 [P.O.:840; IV Piggyback:100] Out: 2225 [Urine:2200; Drains:25]   Intake/Output this shift:  Total I/O In: 120 [P.O.:120] Out: -   Physical Exam: General: NAD, Sitting up in chair   Head: Normocephalic, atraumatic. Moist oral mucosal membranes  Eyes: Anicteric  Neck: Supple, trachea midline  Lungs:  Normal breathing effort, clear   Heart: Regular rate and rhythm, 2/ 6 systolic murmur   Abdomen:  Soft, nontender, bowel sounds present  Extremities: Right foot wound vac, 2+ bilateral lower extremity edema   Neurologic: Nonfocal, moving all four extremities  Skin: No lesions  Access:     Basic Metabolic Panel:  Recent Labs Lab 08/20/16 0455 08/21/16 0326 08/22/16 [id number] 08/22/16 2355 08/23/16 0258 08/24/16 0418 08/25/16 0936  NA 138  --  140 139  --  139 139  K 3.4*  --  3.2* 3.2*  --  3.3* 3.3*  CL 111  --  112* 111  --  112* 109  CO2 18*  --  18* 19*  --  20* 18*  GLUCOSE 201*  --  240* 233*  --  197* 257*  BUN 49*  --  42* 43*  --  44* 43*  CREATININE 3.46*  --  2.87* 2.88* 2.77* 2.89* 2.63*  CALCIUM 8.1*  --  8.6* 8.4*  --  8.4* 8.6*  PHOS  --  3.9 3.9  --   --   --   --      Liver Function Tests:  Recent Labs Lab 08/18/16 1857 08/22/16 0608  AST 21  --   ALT 11*  --   ALKPHOS 66  --   BILITOT 0.3  --   PROT 7.1  --   ALBUMIN 2.6* 2.2*   No results for input(s): LIPASE, AMYLASE in the last 168 hours. No results for input(s): AMMONIA in the last 168 hours.  CBC:  Recent Labs Lab 08/18/16 1857 08/19/16 0521 08/20/16 0455 08/21/16 0326 08/24/16 0418  WBC 17.0* 9.7 9.4 7.4 6.3  NEUTROABS 15.1*  --   --   --   --   HGB 9.1* 8.7* 8.6* 8.6* 8.0*  HCT 28.1* 25.7* 25.5* 25.4* 23.3*  MCV 85.8 85.6 84.4 84.7 84.0  PLT 285 199 258 260 314    Cardiac Enzymes: No results for input(s): CKTOTAL, CKMB, CKMBINDEX, TROPONINI in the last 168 hours.  BNP: Invalid input(s): POCBNP  CBG:  Recent Labs Lab 08/24/16 1102 08/24/16 1622 08/24/16 2129 08/25/16 0805 08/25/16 1124  GLUCAP 290* 208* 160* 182* 239*    Microbiology: Results for orders placed or performed during the hospital encounter of 08/18/16  Blood Culture (routine x 2)  Status: None   Collection Time: 08/18/16  6:58 PM  Result Value Ref Range Status   Specimen Description BLOOD LEFT ARM  Final   Special Requests   Final    BOTTLES DRAWN AEROBIC AND ANAEROBIC  AER10CC ANA 10CC   Culture NO GROWTH 5 DAYS  Final   Report Status 08/23/2016 FINAL  Final  Blood Culture (routine x 2)     Status: None   Collection Time: 08/18/16  6:58 PM  Result Value Ref Range Status   Specimen Description BLOOD  RIGHT ARM  Final   Special Requests   Final    BOTTLES DRAWN AEROBIC AND ANAEROBIC  AER 1CC ANA 1CC   Culture NO GROWTH 5 DAYS  Final   Report Status 08/23/2016 FINAL  Final  Surgical PCR screen     Status: None   Collection Time: 08/18/16 11:15 PM  Result Value Ref Range Status   MRSA, PCR NEGATIVE NEGATIVE Final   Staphylococcus aureus NEGATIVE NEGATIVE Final    Comment:        The Xpert SA Assay (FDA approved for NASAL specimens in patients over 44 years of age), is one  component of a comprehensive surveillance program.  Test performance has been validated by [facility] for patients greater than or equal to 38 year old. It is not intended to diagnose infection nor to guide or monitor treatment.   Aerobic/Anaerobic Culture (surgical/deep wound)     Status: None   Collection Time: 08/19/16  2:09 PM  Result Value Ref Range Status   Specimen Description WOUND  Final   Special Requests Normal  Final   Gram Stain   Final    RARE WBC PRESENT, PREDOMINANTLY PMN ABUNDANT GRAM POSITIVE COCCI IN PAIRS FEW GRAM NEGATIVE RODS FEW GRAM POSITIVE COCCI IN CLUSTERS    Culture   Final    MODERATE VIRIDANS STREPTOCOCCUS FEW ENTEROCOCCUS FAECALIS NO ANAEROBES ISOLATED Performed at [facility]    Report Status 08/24/2016 FINAL  Final   Organism ID, Bacteria ENTEROCOCCUS FAECALIS  Final      Susceptibility   Enterococcus faecalis - MIC*    AMPICILLIN <=2 SENSITIVE Sensitive     VANCOMYCIN 1 SENSITIVE Sensitive     GENTAMICIN SYNERGY SENSITIVE Sensitive     * FEW ENTEROCOCCUS FAECALIS    Coagulation Studies: No results for input(s): LABPROT, INR in the last 72 hours.  Urinalysis: No results for input(s): COLORURINE, LABSPEC, PHURINE, GLUCOSEU, HGBUR, BILIRUBINUR, KETONESUR, PROTEINUR, UROBILINOGEN, NITRITE, LEUKOCYTESUR in the last 72 hours.  Invalid input(s): APPERANCEUR    Imaging: No results found.   Medications:     . amLODipine  5 mg Oral Daily  . ampicillin-sulbactam (UNASYN) IV  3 g Intravenous Q12H  . atorvastatin  10 mg Oral Daily  . calcitRIOL  0.25 mcg Oral Daily  . FLUoxetine  40 mg Oral Daily  . furosemide  40 mg Intravenous Once  . furosemide  80 mg Oral BID  . gabapentin  800 mg Oral BID  . heparin  5,000 Units Subcutaneous Q8H  . insulin aspart  0-5 Units Subcutaneous QHS  . insulin aspart  0-9 Units Subcutaneous TID WC  . insulin aspart protamine- aspart  30 Units Subcutaneous BID WC  . isosorbide mononitrate  120  mg Oral Daily  . LORazepam  1 mg Oral BID  . metoprolol tartrate  100 mg Oral BID  . pantoprazole  40 mg Oral QAC breakfast  . potassium chloride  40 mEq Oral Once  acetaminophen **OR** acetaminophen, ipratropium-albuterol, ondansetron **OR** ondansetron (ZOFRAN) IV, oxyCODONE  Assessment/ Plan:  [name] is a 65 y.o. white female with hypertension, hyperlipidemia, diabetes mellitus type II Insulin dependent, coronary artery disease status post CABG, anxiety/depression, cholecystectomy  who [facility] for Wound infection [T14.8XXA, L08.9] Cellulitis of right lower extremity [L03.115] Sepsis, due to unspecified organism (HCC) [A41.9] Osteomyelitis of right foot, unspecified type (HCC) [M86.9]   1. Acute renal failure/chronic kidney disease stage IV: history suggestive that this is chronic kidney disease secondary to diabetic nephropathy and hypertension.   Patient states baseline Cr in the 2.5 range -  Renal function appears to be stabilizing at creatinine 2.7-2.9 - Today's creatinine is 2.63/ GFR 18  2. Diabetes mellitus type II with CKD: insulin dependent: hemoglobin A1c 6.2%  -Continue to try to achieve good control of blood sugars  3. Anemia of chronic kidney disease: hemoglobin low at 8.0. Iron deficiency.  - Avoid iron for now. Consider Epogen if hemoglobin continues to drop.  4. Secondary Hyperparathyroidism: with hypocalcemia. Phosphorus at goal.  - PTH high at 230. Continue calcitriol 0.25 g by mouth daily.  5. Peripheral vascular disease with diabetic foot ulcer: status post angiogram Dr. Wyn Quakerew on 10/27 and debridement on 10/26 Dr. Orland Jarredroxler - IV antibiotics as per internal medicine team  6. Lower extremity edema Sodium restricted diet Avoid drinking too much fluid. Follow thirst Currently getting Lasix 80 mg twice a day Avoid hypotension    LOS: 7 Brooke [name] 10/31/201712:13 PM

## 2016-08-25 NOTE — Progress Notes (Signed)
Social work Theatre manager met with patient at bedside and gave bed offers. Patient has acceptd to go to [facility]. Social work Theatre manager explained that [facility] would most likely have a private room available today and would be able to assist with patient's wound vac. Patient verbally agreed that she understand.Clinical Education officer, museum (CSW) left a voicemail with [facility] admissions coordinator and is waiting on a call back. Social work Theatre manager will continue to assist and follow as needed.    Danie Chandler, Social Work Intern [phone]

## 2016-08-25 NOTE — Care Management Important Message (Signed)
Important Message  Patient Details  Name: Brooke [name] MRN: [id number] Date of Birth: 10-Feb-1951   Medicare Important Message Given:  Yes    Brooke MemosLisa M [name], RN 08/25/2016, 8:59 AM

## 2016-08-25 NOTE — Progress Notes (Signed)
Patient is medically stable for D/C to [facility] today. Per [name] liaison patient can come today to room 503 and reported that the wound vac will be delivered to [facility] today. RN will call report to RN [name] and arrange EMS for transport. Clinical Child psychotherapistocial Worker (CSW) sent D/C orders to [name] via [employer]. Patient is aware of above. Per patient she does have her own cpap machine at her friend's house in [name] and reported that her friend will take it to [facility]. CSW attempted to contact patient's friend [name] however she did not answer and a voicemail was left. Please reconsult if future social work needs arise. CSW signing off.   [employer]Bailey Lluvia Gwynne, LCSW [phone]

## 2016-08-25 NOTE — NC FL2 (Signed)
[place] MEDICAID FL2 LEVEL OF CARE SCREENING TOOL     IDENTIFICATION  Patient Name: Brooke [name] Birthdate: Mar 11, 1951 Sex: female Admission Date (Current Location): 08/18/2016  [place]   Facility and Address:  [facility], [address], [place], [place] [address]      Provider Number: [id number]  Attending Physician Name and Address:  Katharina Caperima Vaickute, MD  Relative Name and Phone Number:       Current Level of Care: Hospital Recommended Level of Care: Skilled Nursing Facility Prior Approval Number:    Date Approved/Denied:   PASRR Number: [id number]  Discharge Plan: SNF    Current Diagnoses: Patient Active Problem List   Diagnosis Date Noted  . Foot ulcer (HCC) 08/25/2016  . Cellulitis of foot, right 08/25/2016  . Sepsis (HCC) 08/18/2016  . Diabetes mellitus with foot ulcer (HCC) 08/18/2016  . HTN (hypertension) 08/18/2016  . CAD (coronary artery disease) 08/18/2016  . CKD (chronic kidney disease) stage 4, GFR 15-29 ml/min (HCC) 08/18/2016  . Anxiety 08/18/2016  . GERD (gastroesophageal reflux disease) 08/18/2016  . HLD (hyperlipidemia) 08/18/2016    Orientation RESPIRATION BLADDER Height & Weight     Self, Time, Situation, Place  Normal Continent Weight: 255 lb (115.7 kg) Height:  5\' 9"  (175.3 cm)  BEHAVIORAL SYMPTOMS/MOOD NEUROLOGICAL BOWEL NUTRITION STATUS      Continent    AMBULATORY STATUS COMMUNICATION OF NEEDS Skin   Extensive Assist Verbally Surgical wounds                       Personal Care Assistance Level of Assistance  Bathing, Dressing Bathing Assistance: Limited assistance   Dressing Assistance: Limited assistance     Functional Limitations Info             SPECIAL CARE FACTORS FREQUENCY  PT (By licensed PT)     PT Frequency: up to 5X per day 5X per week              Contractures Contractures Info: Present    Additional Factors Info                   Current Medications (08/25/2016):  This is the current hospital active medication list Current Facility-Administered Medications  Medication Dose Route Frequency Provider Last Rate Last Dose  . acetaminophen (TYLENOL) tablet 650 mg  650 mg Oral Q6H PRN Oralia Manisavid Willis, MD       Or  . acetaminophen (TYLENOL) suppository 650 mg  650 mg Rectal Q6H PRN Oralia Manisavid Willis, MD      . amLODipine (NORVASC) tablet 5 mg  5 mg Oral Daily Munsoor Lateef, MD   5 mg at 08/25/16 0850  . Ampicillin-Sulbactam (UNASYN) 3 g in sodium chloride 0.9 % 100 mL IVPB  3 g Intravenous Q12H Katharina Caperima Vaickute, MD   3 g at 08/25/16 0513  . atorvastatin (LIPITOR) tablet 10 mg  10 mg Oral Daily Oralia Manisavid Willis, MD   10 mg at 08/25/16 0850  . calcitRIOL (ROCALTROL) capsule 0.25 mcg  0.25 mcg Oral Daily Munsoor Lateef, MD   0.25 mcg at 08/25/16 0850  . FLUoxetine (PROZAC) capsule 40 mg  40 mg Oral Daily Oralia Manisavid Willis, MD   40 mg at 08/25/16 0850  . furosemide (LASIX) injection 40 mg  40 mg Intravenous Once Katharina Caperima Vaickute, MD      . furosemide (LASIX) tablet 80 mg  80 mg Oral BID Katharina Caperima Vaickute, MD   80 mg  at 08/25/16 0849  . gabapentin (NEURONTIN) capsule 800 mg  800 mg Oral BID Oralia Manisavid Willis, MD   800 mg at 08/25/16 0850  . heparin injection 5,000 Units  5,000 Units Subcutaneous Q8H Oralia Manisavid Willis, MD   5,000 Units at 08/25/16 [phone]  . insulin aspart (novoLOG) injection 0-5 Units  0-5 Units Subcutaneous QHS Houston SirenVivek J Sainani, MD   2 Units at 08/23/16 2142  . insulin aspart (novoLOG) injection 0-9 Units  0-9 Units Subcutaneous TID WC Houston SirenVivek J Sainani, MD   2 Units at 08/25/16 [phone]  . insulin aspart protamine- aspart (NOVOLOG MIX 70/30) injection 30 Units  30 Units Subcutaneous BID WC Katharina Caperima Vaickute, MD   30 Units at 08/25/16 0851  . ipratropium-albuterol (DUONEB) 0.5-2.5 (3) MG/3ML nebulizer solution 3 mL  3 mL Nebulization Q6H PRN Katharina Caperima Vaickute, MD      . isosorbide mononitrate (IMDUR) 24 hr tablet 120 mg  120 mg Oral Daily Oralia Manisavid Willis, MD   120 mg at  08/25/16 0850  . LORazepam (ATIVAN) tablet 1 mg  1 mg Oral BID Oralia Manisavid Willis, MD   1 mg at 08/25/16 0851  . metoprolol (LOPRESSOR) tablet 100 mg  100 mg Oral BID Munsoor Lateef, MD   100 mg at 08/25/16 0851  . ondansetron (ZOFRAN) tablet 4 mg  4 mg Oral Q6H PRN Oralia Manisavid Willis, MD       Or  . ondansetron [facility](ZOFRAN) injection 4 mg  4 mg Intravenous Q6H PRN Oralia Manisavid Willis, MD      . oxyCODONE (Oxy IR/ROXICODONE) immediate release tablet 5 mg  5 mg Oral Q4H PRN Houston SirenVivek J Sainani, MD   5 mg at 08/22/16 2308  . pantoprazole (PROTONIX) EC tablet 40 mg  40 mg Oral QAC breakfast Oralia Manisavid Willis, MD   40 mg at 08/25/16 0850  . potassium chloride SA (K-DUR,KLOR-CON) CR tablet 40 mEq  40 mEq Oral Once Katharina Caperima Vaickute, MD         Discharge Medications: Please see discharge summary for a list of discharge medications.  Relevant Imaging Results:  Relevant Lab Results:   Additional Information    Croy Drumwright, Darleen CrockerBailey M, LCSW

## 2016-09-07 ENCOUNTER — Ambulatory Visit
Admission: RE | Admit: 2016-09-07 | Discharge: 2016-09-07 | Disposition: A | Discharge status: Home / Self Care | Payer: No Typology Code available for payment source | Source: Ambulatory Visit | Attending: Podiatry | Admitting: Podiatry

## 2016-09-07 ENCOUNTER — Encounter
Admission: RE | Admit: 2016-09-07 | Discharge: 2016-09-07 | Disposition: A | Discharge status: Home / Self Care | Payer: No Typology Code available for payment source | Source: Ambulatory Visit | Attending: Podiatry | Admitting: Podiatry

## 2016-09-07 DIAGNOSIS — I129 Hypertensive chronic kidney disease with stage 1 through stage 4 chronic kidney disease, or unspecified chronic kidney disease: Secondary | ICD-10-CM | POA: Insufficient documentation

## 2016-09-07 DIAGNOSIS — E1122 Type 2 diabetes mellitus with diabetic chronic kidney disease: Secondary | ICD-10-CM | POA: Diagnosis not present

## 2016-09-07 DIAGNOSIS — F419 Anxiety disorder, unspecified: Secondary | ICD-10-CM | POA: Diagnosis not present

## 2016-09-07 DIAGNOSIS — A419 Sepsis, unspecified organism: Secondary | ICD-10-CM | POA: Insufficient documentation

## 2016-09-07 DIAGNOSIS — E1140 Type 2 diabetes mellitus with diabetic neuropathy, unspecified: Secondary | ICD-10-CM | POA: Insufficient documentation

## 2016-09-07 DIAGNOSIS — Z01818 Encounter for other preprocedural examination: Secondary | ICD-10-CM | POA: Insufficient documentation

## 2016-09-07 DIAGNOSIS — M879 Osteonecrosis, unspecified: Secondary | ICD-10-CM | POA: Insufficient documentation

## 2016-09-07 DIAGNOSIS — L03115 Cellulitis of right lower limb: Secondary | ICD-10-CM | POA: Insufficient documentation

## 2016-09-07 DIAGNOSIS — I2510 Atherosclerotic heart disease of native coronary artery without angina pectoris: Secondary | ICD-10-CM | POA: Insufficient documentation

## 2016-09-07 DIAGNOSIS — I739 Peripheral vascular disease, unspecified: Secondary | ICD-10-CM | POA: Insufficient documentation

## 2016-09-07 DIAGNOSIS — L97509 Non-pressure chronic ulcer of other part of unspecified foot with unspecified severity: Secondary | ICD-10-CM | POA: Insufficient documentation

## 2016-09-07 DIAGNOSIS — N184 Chronic kidney disease, stage 4 (severe): Secondary | ICD-10-CM | POA: Insufficient documentation

## 2016-09-07 DIAGNOSIS — E785 Hyperlipidemia, unspecified: Secondary | ICD-10-CM | POA: Insufficient documentation

## 2016-09-07 DIAGNOSIS — Z01812 Encounter for preprocedural laboratory examination: Secondary | ICD-10-CM | POA: Insufficient documentation

## 2016-09-07 HISTORY — DX: Anemia, unspecified: D64.9

## 2016-09-07 HISTORY — DX: Sleep apnea, unspecified: G47.30

## 2016-09-07 HISTORY — DX: Major depressive disorder, single episode, unspecified: F32.9

## 2016-09-07 HISTORY — DX: Gout, unspecified: M10.9

## 2016-09-07 HISTORY — DX: Polyneuropathy, unspecified: G62.9

## 2016-09-07 HISTORY — DX: Unspecified osteoarthritis, unspecified site: M19.90

## 2016-09-07 HISTORY — DX: Depression, unspecified: F32.A

## 2016-09-07 HISTORY — DX: Acute myocardial infarction, unspecified: I21.9

## 2016-09-07 HISTORY — DX: Peripheral vascular disease, unspecified: I73.9

## 2016-09-07 HISTORY — DX: Chronic kidney disease, unspecified: N18.9

## 2016-09-07 LAB — CBC
HCT: 30.8 % — ABNORMAL LOW (ref 35.0–47.0)
Hemoglobin: 10.3 g/dL — ABNORMAL LOW (ref 12.0–16.0)
MCH: 28 pg (ref 26.0–34.0)
MCHC: 33.5 g/dL (ref 32.0–36.0)
MCV: 83.5 fL (ref 80.0–100.0)
PLATELETS: 232 10*3/uL (ref 150–440)
RBC: 3.69 MIL/uL — ABNORMAL LOW (ref 3.80–5.20)
RDW: 15.4 % — AB (ref 11.5–14.5)
WBC: 6.6 10*3/uL (ref 3.6–11.0)

## 2016-09-07 LAB — DIFFERENTIAL
BASOS ABS: 0 10*3/uL (ref 0–0.1)
BASOS PCT: 1 %
EOS ABS: 0.3 10*3/uL (ref 0–0.7)
Eosinophils Relative: 4 %
Lymphocytes Relative: 30 %
Lymphs Abs: 2 10*3/uL (ref 1.0–3.6)
MONO ABS: 0.5 10*3/uL (ref 0.2–0.9)
MONOS PCT: 8 %
NEUTROS ABS: 3.8 10*3/uL (ref 1.4–6.5)
Neutrophils Relative %: 57 %

## 2016-09-07 LAB — BASIC METABOLIC PANEL
ANION GAP: 8 (ref 5–15)
BUN: 30 mg/dL — AB (ref 6–20)
CALCIUM: 9.1 mg/dL (ref 8.9–10.3)
CO2: 26 mmol/L (ref 22–32)
Chloride: 107 mmol/L (ref 101–111)
Creatinine, Ser: 2.54 mg/dL — ABNORMAL HIGH (ref 0.44–1.00)
GFR calc Af Amer: 22 mL/min — ABNORMAL LOW (ref >60)
GFR, EST NON AFRICAN AMERICAN: 19 mL/min — AB (ref >60)
GLUCOSE: 89 mg/dL (ref 65–99)
POTASSIUM: 3.2 mmol/L — AB (ref 3.5–5.1)
SODIUM: 141 mmol/L (ref 135–145)

## 2016-09-07 NOTE — Patient Instructions (Signed)
  Your procedure is scheduled on: September 11, 2016 (Friday) Report to Same Day Surgery 2nd floor [facility] ARRIVAL TIME 10:45 AM  Remember: Instructions that are not followed completely may result in serious medical risk, up to and including death, or upon the discretion of your surgeon and anesthesiologist your surgery may need to be rescheduled.    _x___ 1. Do not eat food or drink liquids after midnight. No gum chewing or hard candies.     __x__ 2. No Alcohol for 24 hours before or after surgery.   __x__3. No Smoking for 24 prior to surgery.   ____  4. Bring all medications with you on the day of surgery if instructed.    __x__ 5. Notify your doctor if there is any change in your medical condition     (cold, fever, infections).     Do not wear jewelry, make-up, hairpins, clips or nail polish.  Do not wear lotions, powders, or perfumes. You may wear deodorant.  Do not shave 48 hours prior to surgery. Men may shave face and neck.  Do not bring valuables to the hospital.    [facility] is not responsible for any belongings or valuables.               Contacts, dentures or bridgework may not be worn into surgery.  Leave your suitcase in the car. After surgery it may be brought to your room.  For patients admitted to the hospital, discharge time is determined by your treatment team.   Patients discharged the day of surgery will not be allowed to drive home.    Please read over the following fact sheets that you were given:   [facility] Preparing for Surgery and or MRSA Information   _x___ Take these medicines the morning of surgery with A SIP OF WATER:    1. Gabapentin  2.  Magnesium Oxide  3. Isosorbide  4. Losartan  5. Metoprolol  6. Omeprazole   ____Fleets enema or Magnesium Citrate as directed.   _x__ Use CHG Soap or sage wipes as directed on instruction sheet   ____ Use inhalers on the day of surgery and bring to hospital day of surgery  ____ Stop metformin 2  days prior to surgery    __x_ Take 1/2 of usual insulin dose the night before surgery and none on the morning of surgery. (NO INSULIN OR VICTOZA THE MORNING OF SURGERY)                ____ Stop aspirin or coumadin, or plavix  x__ Stop Anti-inflammatories such as Advil, Aleve, Ibuprofen, Motrin, Naproxen,          Naprosyn, Goodies powders or aspirin products. Ok to take Tylenol.   _x___ Stop supplements until after surgery.  (Stop Omega- 3 now )   _x__ Bring C-Pap to the hospital.     X    Bring Wound Vac supplies as instructed by Dr. Orland Jarredroxler

## 2016-09-07 NOTE — Pre-Procedure Instructions (Signed)
Potassium results (3.2)  faxed and called to Dr. Orland Jarredroxler office (spoke to [place]). Instructed need potassium supplement prior to surgery per anesthesia protocol.

## 2016-09-08 ENCOUNTER — Encounter: Payer: Self-pay | Admitting: *Deleted

## 2016-09-08 NOTE — Pre-Procedure Instructions (Addendum)
EKG OK'ED BY DR Randa NgoPISCITELLO  CKD STAGE 4 WITH BASELINE CREAT PER PATIENT 2.5. SEE NOTE ON CHART FROM 08/24/16 HOSPITAL DISCHARGE SUMMARY FROM HOSPITALIST AND DR H SINGH,STATING RENAL FUNCTION APPEARS TO BE STABLIZING AT 2.7-2.9. LABS 09/07/16 CREAT 2.54. HAD SURGERY DURING ADMISSION 10/17

## 2016-09-10 MED ORDER — CEFAZOLIN SODIUM-DEXTROSE 2-4 GM/100ML-% IV SOLN
2.0000 g | Freq: Once | INTRAVENOUS | Status: AC
  Administered 2016-09-11: 2 g via INTRAVENOUS

## 2016-09-11 ENCOUNTER — Encounter
Admission: RE | Disposition: A | Discharge status: Home / Self Care | Payer: Self-pay | Source: Ambulatory Visit | Attending: Podiatry

## 2016-09-11 ENCOUNTER — Ambulatory Visit
Admission: RE | Admit: 2016-09-11 | Discharge: 2016-09-11 | Disposition: A | Discharge status: Home / Self Care | Payer: Medicare Other | Source: Ambulatory Visit | Attending: Podiatry | Admitting: Podiatry

## 2016-09-11 ENCOUNTER — Ambulatory Visit: Payer: Medicare Other | Admitting: Anesthesiology

## 2016-09-11 DIAGNOSIS — M1990 Unspecified osteoarthritis, unspecified site: Secondary | ICD-10-CM | POA: Diagnosis not present

## 2016-09-11 DIAGNOSIS — E1122 Type 2 diabetes mellitus with diabetic chronic kidney disease: Secondary | ICD-10-CM | POA: Diagnosis not present

## 2016-09-11 DIAGNOSIS — F419 Anxiety disorder, unspecified: Secondary | ICD-10-CM | POA: Insufficient documentation

## 2016-09-11 DIAGNOSIS — E785 Hyperlipidemia, unspecified: Secondary | ICD-10-CM | POA: Diagnosis not present

## 2016-09-11 DIAGNOSIS — N189 Chronic kidney disease, unspecified: Secondary | ICD-10-CM | POA: Insufficient documentation

## 2016-09-11 DIAGNOSIS — E669 Obesity, unspecified: Secondary | ICD-10-CM | POA: Diagnosis not present

## 2016-09-11 DIAGNOSIS — F329 Major depressive disorder, single episode, unspecified: Secondary | ICD-10-CM | POA: Diagnosis not present

## 2016-09-11 DIAGNOSIS — E1151 Type 2 diabetes mellitus with diabetic peripheral angiopathy without gangrene: Secondary | ICD-10-CM | POA: Diagnosis not present

## 2016-09-11 DIAGNOSIS — I2510 Atherosclerotic heart disease of native coronary artery without angina pectoris: Secondary | ICD-10-CM | POA: Diagnosis not present

## 2016-09-11 DIAGNOSIS — Z87891 Personal history of nicotine dependence: Secondary | ICD-10-CM | POA: Diagnosis not present

## 2016-09-11 DIAGNOSIS — I252 Old myocardial infarction: Secondary | ICD-10-CM | POA: Diagnosis not present

## 2016-09-11 DIAGNOSIS — Z794 Long term (current) use of insulin: Secondary | ICD-10-CM | POA: Diagnosis not present

## 2016-09-11 DIAGNOSIS — K219 Gastro-esophageal reflux disease without esophagitis: Secondary | ICD-10-CM | POA: Insufficient documentation

## 2016-09-11 DIAGNOSIS — I129 Hypertensive chronic kidney disease with stage 1 through stage 4 chronic kidney disease, or unspecified chronic kidney disease: Secondary | ICD-10-CM | POA: Insufficient documentation

## 2016-09-11 DIAGNOSIS — E11621 Type 2 diabetes mellitus with foot ulcer: Secondary | ICD-10-CM | POA: Insufficient documentation

## 2016-09-11 DIAGNOSIS — Z888 Allergy status to other drugs, medicaments and biological substances status: Secondary | ICD-10-CM | POA: Diagnosis not present

## 2016-09-11 DIAGNOSIS — Z6835 Body mass index (BMI) 35.0-35.9, adult: Secondary | ICD-10-CM | POA: Insufficient documentation

## 2016-09-11 DIAGNOSIS — G4730 Sleep apnea, unspecified: Secondary | ICD-10-CM | POA: Diagnosis not present

## 2016-09-11 DIAGNOSIS — D649 Anemia, unspecified: Secondary | ICD-10-CM | POA: Diagnosis not present

## 2016-09-11 DIAGNOSIS — M868X7 Other osteomyelitis, ankle and foot: Secondary | ICD-10-CM | POA: Diagnosis not present

## 2016-09-11 HISTORY — PX: AMPUTATION: SHX166

## 2016-09-11 LAB — POCT I-STAT 4, (NA,K, GLUC, HGB,HCT)
GLUCOSE: 159 mg/dL — AB (ref 65–99)
HEMATOCRIT: 28 % — AB (ref 36.0–46.0)
HEMOGLOBIN: 9.5 g/dL — AB (ref 12.0–15.0)
Potassium: 4.7 mmol/L (ref 3.5–5.1)
Sodium: 143 mmol/L (ref 135–145)

## 2016-09-11 LAB — GLUCOSE, CAPILLARY: Glucose-Capillary: 133 mg/dL — ABNORMAL HIGH (ref 65–99)

## 2016-09-11 SURGERY — AMPUTATION, FOOT, RAY
Anesthesia: General | Site: Foot | Laterality: Right | Wound class: Dirty or Infected

## 2016-09-11 MED ORDER — MIDAZOLAM HCL 2 MG/2ML IJ SOLN
INTRAMUSCULAR | Status: DC | PRN
  Administered 2016-09-11: 1 mg via INTRAVENOUS

## 2016-09-11 MED ORDER — SUCCINYLCHOLINE CHLORIDE 20 MG/ML IJ SOLN
INTRAMUSCULAR | Status: DC | PRN
  Administered 2016-09-11: 100 mg via INTRAVENOUS

## 2016-09-11 MED ORDER — LIDOCAINE HCL (PF) 1 % IJ SOLN
INTRAMUSCULAR | Status: AC
  Filled 2016-09-11: qty 30

## 2016-09-11 MED ORDER — LIDOCAINE HCL (CARDIAC) 20 MG/ML IV SOLN
INTRAVENOUS | Status: DC | PRN
  Administered 2016-09-11: 40 mg via INTRAVENOUS

## 2016-09-11 MED ORDER — FENTANYL CITRATE (PF) 100 MCG/2ML IJ SOLN
INTRAMUSCULAR | Status: DC | PRN
  Administered 2016-09-11 (×2): 25 ug via INTRAVENOUS
  Administered 2016-09-11: 50 ug via INTRAVENOUS

## 2016-09-11 MED ORDER — GENTAMICIN SULFATE 40 MG/ML IJ SOLN
INTRAMUSCULAR | Status: AC
  Filled 2016-09-11: qty 4

## 2016-09-11 MED ORDER — GLYCOPYRROLATE 0.2 MG/ML IJ SOLN
INTRAMUSCULAR | Status: DC | PRN
  Administered 2016-09-11: 0.2 mg via INTRAVENOUS

## 2016-09-11 MED ORDER — BUPIVACAINE HCL 0.5 % IJ SOLN
INTRAMUSCULAR | Status: DC | PRN
  Administered 2016-09-11: 3 mL

## 2016-09-11 MED ORDER — CEFAZOLIN SODIUM-DEXTROSE 2-4 GM/100ML-% IV SOLN
INTRAVENOUS | Status: AC
  Filled 2016-09-11: qty 100

## 2016-09-11 MED ORDER — FENTANYL CITRATE (PF) 100 MCG/2ML IJ SOLN: 25.0000 ug | INTRAMUSCULAR | Status: DC | PRN

## 2016-09-11 MED ORDER — OXYCODONE HCL 5 MG PO TABS: 5.0000 mg | ORAL_TABLET | ORAL | 0 refills | Status: AC | PRN

## 2016-09-11 MED ORDER — AMOXICILLIN-POT CLAVULANATE 500-125 MG PO TABS: 1.0000 | ORAL_TABLET | Freq: Three times a day (TID) | ORAL | 1 refills | Status: AC

## 2016-09-11 MED ORDER — SODIUM CHLORIDE 0.9 % IV SOLN
INTRAVENOUS | Status: DC
  Administered 2016-09-11 (×3): via INTRAVENOUS

## 2016-09-11 MED ORDER — LIDOCAINE HCL 1 % IJ SOLN
INTRAMUSCULAR | Status: DC | PRN
  Administered 2016-09-11: 3 mL

## 2016-09-11 MED ORDER — VANCOMYCIN HCL 1000 MG IV SOLR
INTRAVENOUS | Status: AC
  Filled 2016-09-11: qty 1000

## 2016-09-11 MED ORDER — PROPOFOL 10 MG/ML IV BOLUS
INTRAVENOUS | Status: DC | PRN
  Administered 2016-09-11: 120 mg via INTRAVENOUS
  Administered 2016-09-11: 30 mg via INTRAVENOUS

## 2016-09-11 MED ORDER — BUPIVACAINE HCL (PF) 0.5 % IJ SOLN
INTRAMUSCULAR | Status: AC
  Filled 2016-09-11: qty 30

## 2016-09-11 MED ORDER — ONDANSETRON HCL 4 MG/2ML IJ SOLN: 4.0000 mg | Freq: Once | INTRAMUSCULAR | Status: DC | PRN

## 2016-09-11 MED ORDER — ONDANSETRON HCL 4 MG/2ML IJ SOLN
INTRAMUSCULAR | Status: DC | PRN
  Administered 2016-09-11: 4 mg via INTRAVENOUS

## 2016-09-11 MED ORDER — PHENYLEPHRINE HCL 10 MG/ML IJ SOLN
INTRAMUSCULAR | Status: DC | PRN
  Administered 2016-09-11 (×3): 100 ug via INTRAVENOUS
  Administered 2016-09-11: 150 ug via INTRAVENOUS
  Administered 2016-09-11 (×5): 100 ug via INTRAVENOUS

## 2016-09-11 MED ORDER — ROCURONIUM BROMIDE 100 MG/10ML IV SOLN
INTRAVENOUS | Status: DC | PRN
  Administered 2016-09-11: 5 mg via INTRAVENOUS

## 2016-09-11 SURGICAL SUPPLY — 42 items
BANDAGE ELASTIC 4 LF NS (GAUZE/BANDAGES/DRESSINGS) ×3 IMPLANT
BANDAGE STRETCH 3X4.1 STRL (GAUZE/BANDAGES/DRESSINGS) IMPLANT
BLADE MED AGGRESSIVE (BLADE) ×3 IMPLANT
BLADE SURG 15 STRL LF DISP TIS (BLADE) ×4 IMPLANT
BLADE SURG 15 STRL SS (BLADE) ×8
BNDG ESMARK 4X12 TAN STRL LF (GAUZE/BANDAGES/DRESSINGS) ×3 IMPLANT
BNDG GAUZE 4.5X4.1 6PLY STRL (MISCELLANEOUS) ×3 IMPLANT
CANISTER SUCT 1200ML W/VALVE (MISCELLANEOUS) ×3 IMPLANT
CUFF TOURN 18 STER (MISCELLANEOUS) ×3 IMPLANT
CUFF TOURN DUAL PL 12 NO SLV (MISCELLANEOUS) IMPLANT
DRAPE FLUOR MINI C-ARM 54X84 (DRAPES) ×3 IMPLANT
DURAPREP 26ML APPLICATOR (WOUND CARE) ×3 IMPLANT
ELECT REM PT RETURN 9FT ADLT (ELECTROSURGICAL) ×3
ELECTRODE REM PT RTRN 9FT ADLT (ELECTROSURGICAL) ×1 IMPLANT
GAUZE PETRO XEROFOAM 1X8 (MISCELLANEOUS) IMPLANT
GAUZE SPONGE 4X4 12PLY STRL (GAUZE/BANDAGES/DRESSINGS) IMPLANT
GLOVE BIO SURGEON STRL SZ8 (GLOVE) ×3 IMPLANT
GLOVE INDICATOR 7.5 STRL GRN (GLOVE) ×3 IMPLANT
GOWN STRL REUS W/ TWL LRG LVL3 (GOWN DISPOSABLE) ×2 IMPLANT
GOWN STRL REUS W/TWL LRG LVL3 (GOWN DISPOSABLE) ×4
HANDPIECE VERSAJET DEBRIDEMENT (MISCELLANEOUS) ×3 IMPLANT
KIT DRSG VAC SLVR GRANUFM (MISCELLANEOUS) ×3 IMPLANT
KIT RM TURNOVER STRD PROC AR (KITS) ×3 IMPLANT
KIT STIMULAN RAPID CURE 5CC (Orthopedic Implant) ×3 IMPLANT
LABEL OR SOLS (LABEL) ×3 IMPLANT
NDL SAFETY 18GX1.5 (NEEDLE) ×3 IMPLANT
NDL SAFETY ECLIPSE 18X1.5 (NEEDLE) ×1 IMPLANT
NEEDLE HYPO 18GX1.5 SHARP (NEEDLE) ×2
NEEDLE HYPO 25X1 1.5 SAFETY (NEEDLE) ×9 IMPLANT
NS IRRIG 500ML POUR BTL (IV SOLUTION) ×3 IMPLANT
PACK EXTREMITY ARMC (MISCELLANEOUS) ×3 IMPLANT
PAD ABD DERMACEA PRESS 5X9 (GAUZE/BANDAGES/DRESSINGS) ×6 IMPLANT
PENCIL ELECTRO HAND CTR (MISCELLANEOUS) ×3 IMPLANT
RASP SM TEAR CROSS CUT (RASP) ×3 IMPLANT
STOCKINETTE STRL 6IN 960660 (GAUZE/BANDAGES/DRESSINGS) ×3 IMPLANT
SUT ETH BLK MONO 3 0 FS 1 12/B (SUTURE) ×6 IMPLANT
SUT ETHILON 5 0 PS 2 18 (SUTURE) ×3 IMPLANT
SUT VIC AB 4-0 FS2 27 (SUTURE) ×3 IMPLANT
SWAB CULTURE AMIES ANAERIB BLU (MISCELLANEOUS) ×3 IMPLANT
SYR 5ML LL (SYRINGE) ×3 IMPLANT
SYRINGE 10CC LL (SYRINGE) ×3 IMPLANT
TOWEL OR 17X26 4PK STRL BLUE (TOWEL DISPOSABLE) ×3 IMPLANT

## 2016-09-11 NOTE — Op Note (Signed)
Operative note   Surgeon: Dr. Recardo EvangelistMatthew Alydia Gosser, DPM.    Assistant: None    Preop diagnosis: Osteomyelitis fifth metatarsal and proximal phalanx base right foot    Postop diagnosis: Same    Procedure:   1. Fifth ray amputation right foot          EBL: 25 mL    Anesthesia:general delivered by anesthesia team and local block delivered by me consisting of 8 cc of 0.5% Marcaine plain mixed half-and-half with 1% lidocaine plain.    Hemostasis: Esmarch bandage 2 ankle for 15 minutes    Specimen: Amputated fifth ray. Fifth metatarsal head was cultured    Complications: None    Operative indications: Chronic wound with resultant osteomyelitis to the fifth metatarsal and fifth toe proximal phalanx    Procedure:  Patient was brought into the OR and placed on the operating table in thesupine position. After anesthesia was obtained theright lower extremity was prepped and draped in usual sterile fashion.  Operative Report: Attention was directed to the lateral fifth metatarsal and fifth toe area. A large wound is present on the fifth metatarsal head area. It has improved some over the last few weeks by utilizing a wound VAC but the central portion of the wound failed to close in and heal and the fifth metatarsal has started to show demineralization consistent with 2 semi- elliptical incisions were made and both portions of the incision went through the wound over the more healthy aspects of the tissue dorsally and plantar to the metatarsal head. Incision was carried to the dorsal and medial and plantar medial aspects of the fifth toe incision was carried down to the bone. Toe was removed at the MTP joint and sent for pathology. Soft tissue was freed wife the metatarsal head. A Fredrik CoveRoger was used to take a portion of the fifth metatarsal head for culture purposes. This time power equipment sagittal saw from the TPS system was used to resect the fifth metatarsal at the point where it had a syndesmosis to  the fourth metatarsal likely from a previous injury. The metatarsal head was resected in toto and the junctional area with the fourth metatarsal was smoothed cleaned and prominent areas of bone were reduced. The bones appear to be healthy at this level. Once this was sufficiently reduce the areas and copiously irrigated. A versa jet debrider was used to remove superficial tissue from the ulcer sites dorsally and plantarly and get good bleeding tissue to those regions. The single granular tissue was noted to both areas.  At this time beads which had earlier been prepared utilizing vancomycin and gentamicin in a Stimulan  product were placed in the wound and the wound was closed with 3-0 nylon simple interrupted sutures. The most part wound was closed primarily except for the central portion where there is still some ulcerative changes. Still there was more superficial granular and tissue that was able to be reapproximated. A wound VAC was then placed on the ulcer and along the incision margin and placed 125 mmHg continuous pulse. A protective wrapping was placed across the area at this timeframe with ABDs pads Kerlix and an Ace wrap. VAC is functioning well.    Patient tolerated the procedure and anesthesia well.  Was transported from the OR to the PACU with all vital signs stable and vascular status intact. To be discharged per routine protocol.  Will follow up in approximately 1 week in the outpatient clinic.

## 2016-09-11 NOTE — Discharge Instructions (Signed)
[facility]  POST OPERATIVE INSTRUCTIONS FOR DR. Audriana Aldama AND [facility]   1. Take your medication as prescribed.  Pain medication should be taken only as needed. Start taking Augmentin again 3 times a day with food  2. Keep the dressing clean, dry and intact.wound VAC should be changed every 3 days   3. Keep foot elevated no higher than heart level .continue to remain completely nonweightbearing on the right foot. Can transfer from bed to chair and from bed to bedside toilet with assistance. Do not take a shower. Baths are permissible as long as the foot is kept out of the water.   4. Every hour you are awake:  - Bend your knee 15 times. - Flex foot 15 times - Massage calf 15 times  5. Call [facility] [phone]) if any of the following problems occur: - You develop a temperature or fever. - The bandage becomes saturated with blood. - Medication does not stop your pain. - Injury of the foot occurs. - Any symptoms of infection including redness, odor, or red streaks running from wound. -

## 2016-09-11 NOTE — Anesthesia Preprocedure Evaluation (Addendum)
Anesthesia Evaluation  Patient identified by MRN, date of birth, ID band Patient awake    Reviewed: Allergy & Precautions, NPO status , Patient's Chart, lab work & pertinent test results, reviewed documented beta blocker date and time   Airway Mallampati: III  TM Distance: >3 FB     Dental  (+) Edentulous Upper, Edentulous Lower   Pulmonary neg pulmonary ROS, sleep apnea and Continuous Positive Airway Pressure Ventilation ,    Pulmonary exam normal        Cardiovascular hypertension, Pt. on medications and Pt. on home beta blockers + CAD, + Past MI and + Peripheral Vascular Disease  Normal cardiovascular exam     Neuro/Psych Anxiety Depression Peripheral neuropathy  Neuromuscular disease    GI/Hepatic Neg liver ROS, GERD  Medicated and Controlled,  Endo/Other  diabetes, Well Controlled, Type 2, Insulin Dependent  Renal/GU Renal InsufficiencyRenal disease  negative genitourinary   Musculoskeletal  (+) Arthritis , Osteoarthritis,    Abdominal (+) + obese,   Peds negative pediatric ROS (+)  Hematology negative hematology ROS (+) anemia ,   Anesthesia Other Findings Past Medical History: No date: Anxiety No date: Coronary artery disease No date: Diabetes mellitus without complication (HCC) No date: GERD (gastroesophageal reflux disease) No date: HLD (hyperlipidemia) No date: Hypertension CABG  Reproductive/Obstetrics                             Anesthesia Physical  Anesthesia Plan  ASA: III  Anesthesia Plan: General   Post-op Pain Management:    Induction: Intravenous  Airway Management Planned: LMA  Additional Equipment:   Intra-op Plan:   Post-operative Plan:   Informed Consent:   Dental advisory given  Plan Discussed with: CRNA and Surgeon  Anesthesia Plan Comments:        Anesthesia Quick Evaluation

## 2016-09-11 NOTE — Anesthesia Procedure Notes (Signed)
Procedure Name: Intubation Date/Time: 09/11/2016 12:10 PM Performed by: Henrietta HooverPOPE, Yassir Enis Pre-anesthesia Checklist: Patient identified, Emergency Drugs available, Suction available, Patient being monitored and Timeout performed Patient Re-evaluated:Patient Re-evaluated prior to inductionOxygen Delivery Method: Circle system utilized Preoxygenation: Pre-oxygenation with 100% oxygen Intubation Type: IV induction Ventilation: Oral airway inserted - appropriate to patient size LMA: LMA inserted LMA Size: 4.0 Laryngoscope Size: Mac and 3 Grade View: Grade II Tube type: Oral Tube size: 7.0 mm Number of attempts: 2 Airway Equipment and Method: Stylet Placement Confirmation: ETT inserted through vocal cords under direct vision,  positive ETCO2 and breath sounds checked- equal and bilateral Secured at: 21 cm Dental Injury: Teeth and Oropharynx as per pre-operative assessment  Comments: #4 LMA inserted first, very large tongue, small leak, moves around easily in mouth.  Decision to intubate due to sleep apnea, large tongue, leak around LMA. DL x 1, MAC3, good view of cords

## 2016-09-11 NOTE — H&P (Signed)
H and P has been reviewed and no changes are noted.  

## 2016-09-11 NOTE — Anesthesia Postprocedure Evaluation (Signed)
Anesthesia Post Note  Patient: Brooke [name]  Procedure(s) Performed: Procedure(s) (LRB): AMPUTATION RAY (Right)  Patient location during evaluation: PACU Anesthesia Type: General Level of consciousness: awake and alert and oriented Pain management: pain level controlled Vital Signs Assessment: post-procedure vital signs reviewed and stable Respiratory status: spontaneous breathing Cardiovascular status: blood pressure returned to baseline Anesthetic complications: no    Last Vitals:  Vitals:   09/11/16 1400 09/11/16 1410  BP: (!) 104/56 113/61  Pulse: 74 73  Resp: 14 13  Temp:      Last Pain:  Vitals:   09/11/16 1400  TempSrc:   PainSc: 0-No pain                 Zahlia Deshazer

## 2016-09-11 NOTE — Transfer of Care (Signed)
Immediate Anesthesia Transfer of Care Note  Patient: Brooke [name]  Procedure(s) Performed: Procedure(s): AMPUTATION RAY (Right)  Patient Location: PACU  Anesthesia Type:General  Level of Consciousness: sedated  Airway & Oxygen Therapy: Patient Spontanous Breathing and Patient connected to face mask oxygen  Post-op Assessment: Report given to RN and Post -op Vital signs reviewed and stable  Post vital signs: Reviewed and stable  Last Vitals:  Vitals:   09/11/16 1057  BP: (!) 171/78  Pulse: 69  Resp: 20  Temp: 37.3 C    Last Pain:  Vitals:   09/11/16 1057  TempSrc: Tympanic         Complications: No apparent anesthesia complications

## 2016-09-14 ENCOUNTER — Encounter: Payer: Self-pay | Admitting: Podiatry

## 2016-09-15 LAB — SURGICAL PATHOLOGY

## 2016-09-17 LAB — AEROBIC/ANAEROBIC CULTURE (SURGICAL/DEEP WOUND)

## 2016-09-17 LAB — AEROBIC/ANAEROBIC CULTURE W GRAM STAIN (SURGICAL/DEEP WOUND)

## 2017-12-11 IMAGING — MR MR FOOT*R* W/O CM
4 of 5 series · 28 of 40 positions shown · non-contrast
Comparison: Plain films of the right foot 08/18/2016.

CLINICAL DATA: Nonhealing wound at the right fifth MTP joint in a
diabetic patient for over 3 months. Question osteomyelitis.

EXAM:
MRI OF THE RIGHT FOREFOOT WITHOUT CONTRAST
TECHNIQUE: Multiplanar, multisequence MR imaging was performed. No intravenous
contrast was administered.

[Series 3: T1 · coronal · 3.0mm · 0.50mm/px · 9 of 40 slices shown (1 of 2)]
[im 1/40]
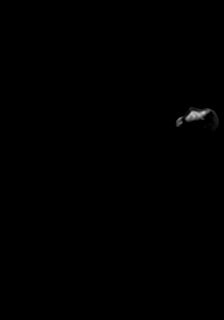
[im 5/40]
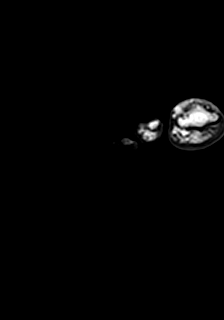
[im 10/40]
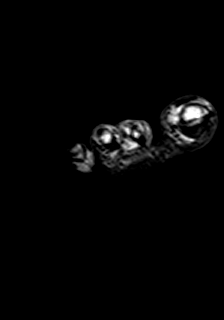
[im 15/40]
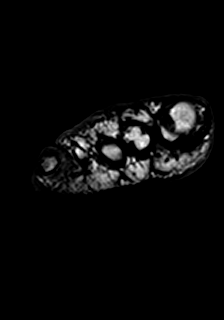
[im 20/40]
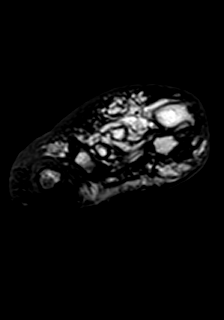
[im 25/40]
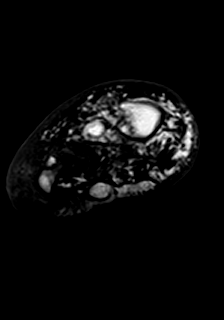
[im 30/40]
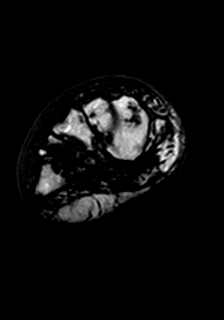
[im 35/40]
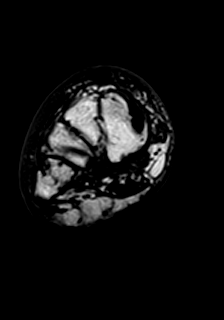
[im 40/40]
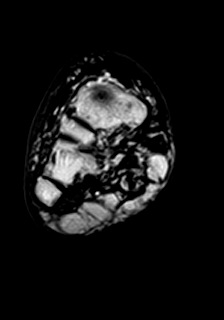

[Series 4: T2 fat-sat · coronal · 3.0mm · 0.35mm/px · 8 of 40 slices shown]
[im 1/40]
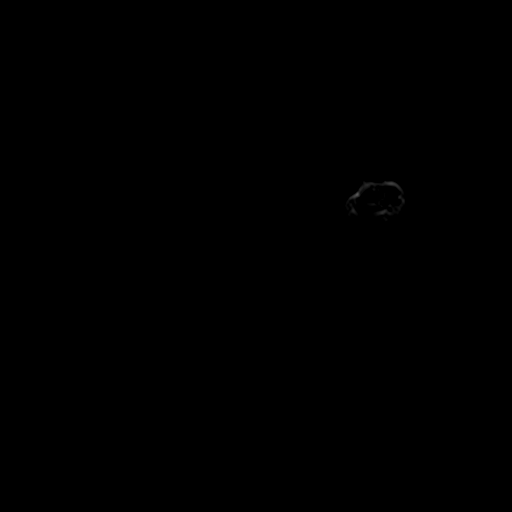
[im 5/40]
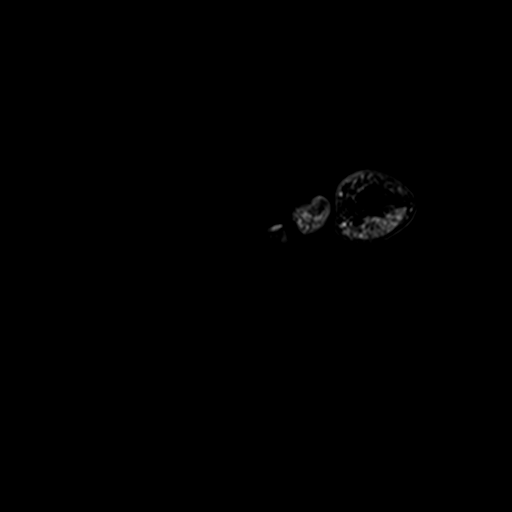
[im 14/40]
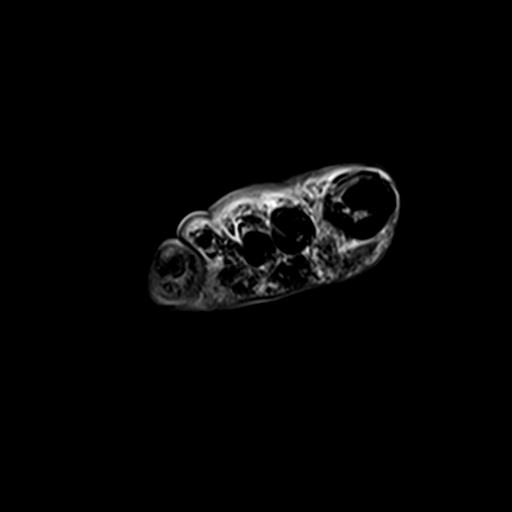
[im 18/40]
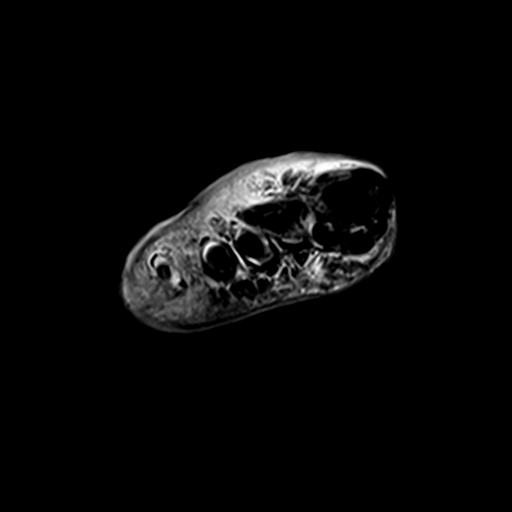
[im 22/40]
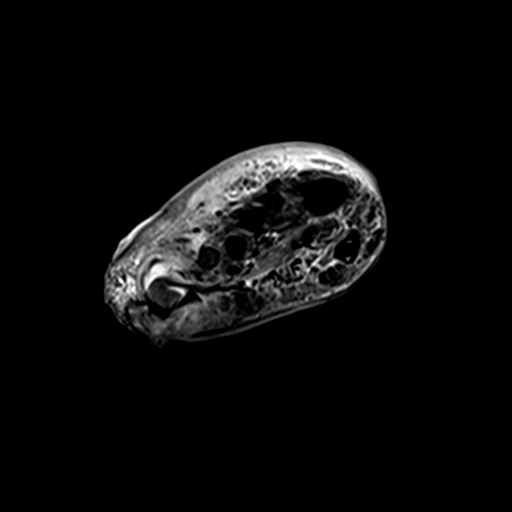
[im 27/40]
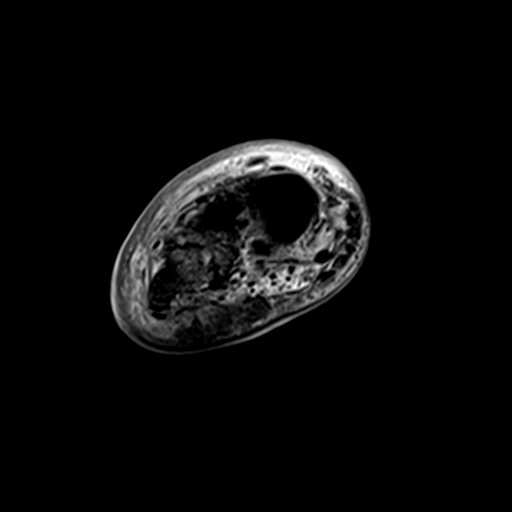
[im 35/40]
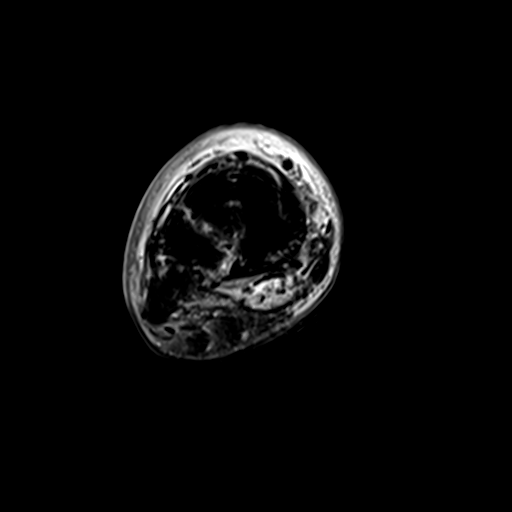
[im 40/40]
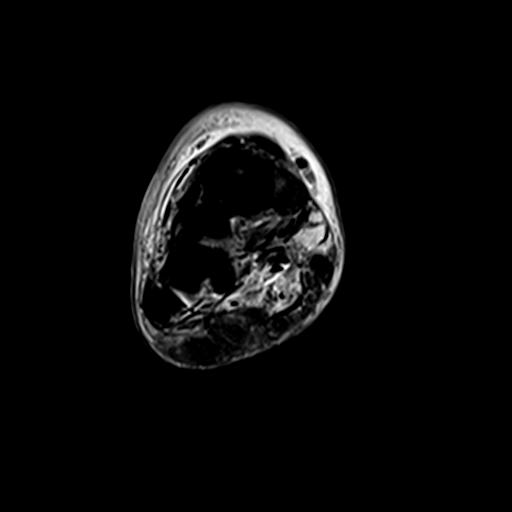

[Series 5: T1 · oblique · 3.0mm · 0.62mm/px · 6 of 23 slices shown (2 of 2)]
[im 1/23]
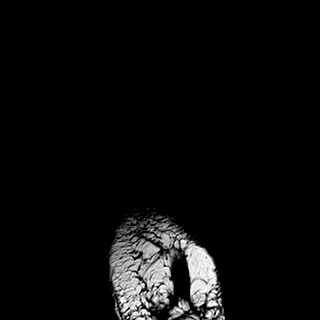
[im 5/23]
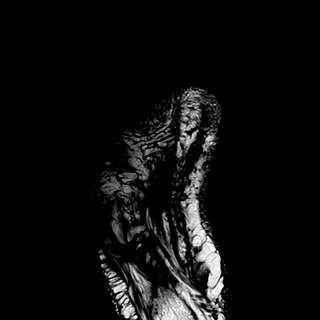
[im 9/23]
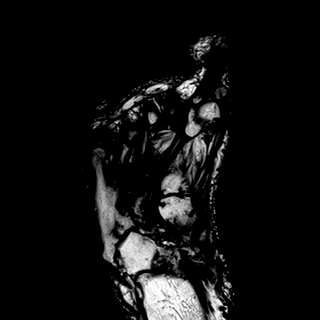
[im 14/23]
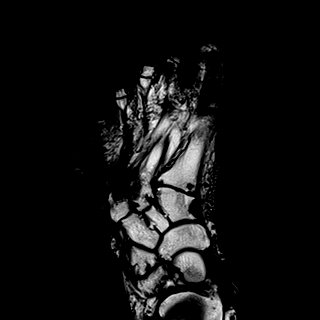
[im 18/23]
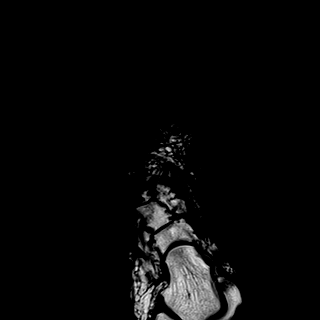
[im 23/23]
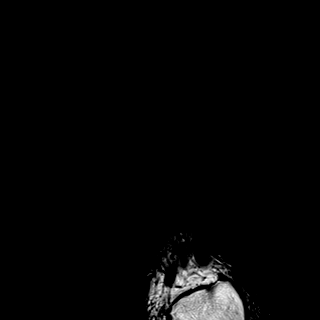

[Series 6: STIR · oblique · 3.0mm · 0.39mm/px · 5 of 23 slices shown]
[im 1/23]
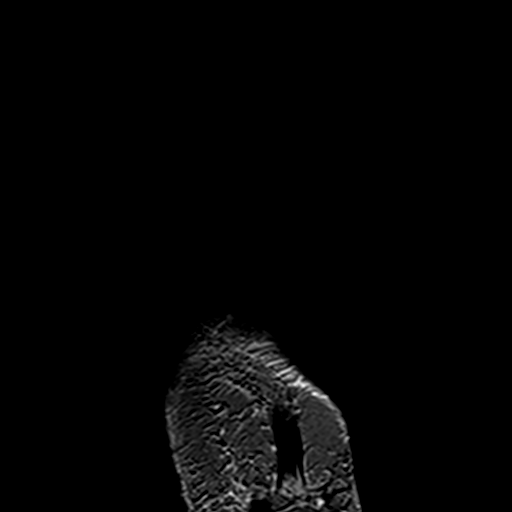
[im 5/23]
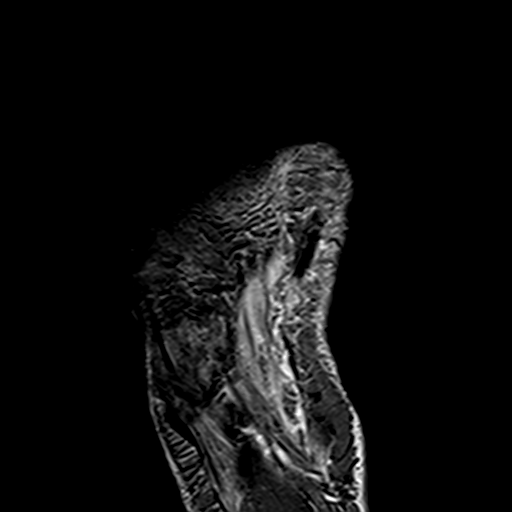
[im 9/23]
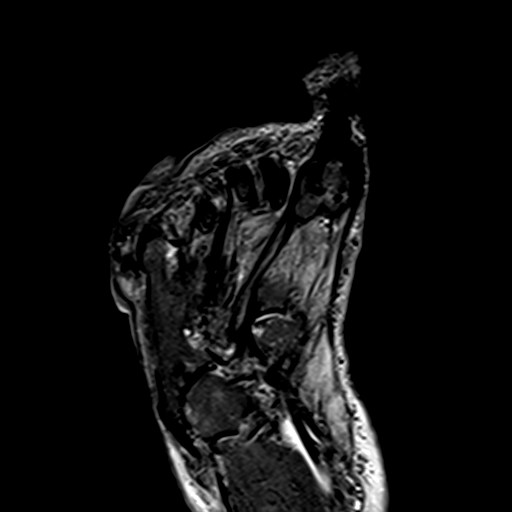
[im 14/23]
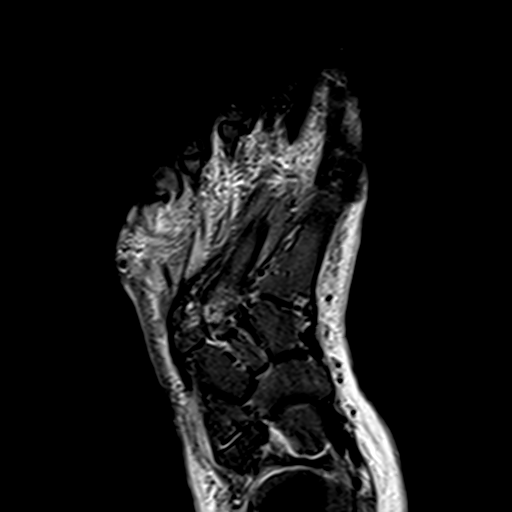
[im 23/23]
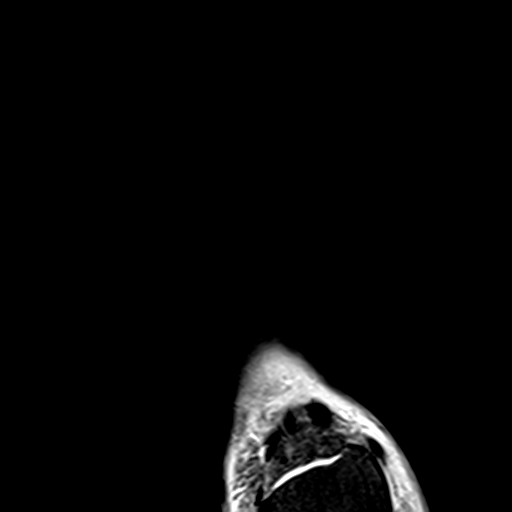

[28 of 40 positions shown; findings below may reference images not displayed]

FINDINGS: No bone marrow signal abnormality to suggest osteomyelitis is
identified. Fusion of the fourth and fifth metatarsals is identified
as seen on the prior plain films. Loss of cortical bone with a rim
of T1 and T2 hypointensity is seen between proximal third and fourth
metatarsals as on prior plain film and may be posttraumatic or due
or infection. Joint space loss and scattered erosions are seen about
the midfoot which may be due to gout and/or neuropathic change.

Extensive subcutaneous edema is present about the foot. A skin wound
with underlying gas at the level of the fifth MTP joint subjacent to
the wound, there is appearing fluid collection which measures
approximately 2.3 cm long by 1.2 cm transverse by 2.4 cm
craniocaudal. Small fifth MTP joint effusion is noted. No mass is
identified.
IMPRESSION: Cellulitis about the foot with a focal skin wound at the level of
the fifth MTP joint and underlying gas worrisome for abscess.

Negative for acute osteomyelitis.

Small fifth MTP joint is likely sympathetic rather than septic as
there is no associated marrow signal change about fifth MTP joint.

Chronic destructive change about the proximal third and fourth
metatarsals could be due to remote trauma or infection.

Neuropathic change and/or gouty arthropathy about the midfoot.

Fusion of the fourth and fifth metatarsals at the level the mid and
distal diaphyses.

## 2019-07-27 DEATH — deceased
